# Patient Record
Sex: Female | Born: 1990 | Race: Black or African American | Hispanic: No | Marital: Single | Smoking: Never smoker
Health system: Western US, Academic
[De-identification: ages and names within clinical notes are randomized; demographics above are authoritative.]

## PROBLEM LIST (undated history)

## (undated) DIAGNOSIS — L309 Dermatitis, unspecified: Secondary | ICD-10-CM

## (undated) DIAGNOSIS — F411 Generalized anxiety disorder: Secondary | ICD-10-CM

## (undated) DIAGNOSIS — B009 Herpesviral infection, unspecified: Secondary | ICD-10-CM

## (undated) HISTORY — PX: INDUCED ABORTION: SHX677

## (undated) HISTORY — DX: Dermatitis, unspecified: L30.9

## (undated) HISTORY — DX: Herpesviral infection, unspecified: B00.9

## (undated) HISTORY — DX: Generalized anxiety disorder: F41.1

## (undated) HISTORY — PX: NO SURGICAL HISTORY: 101002

---

## 2012-06-12 ENCOUNTER — Emergency Department (HOSPITAL_COMMUNITY): Payer: BC Managed Care – PPO

## 2012-06-12 ENCOUNTER — Emergency Department (HOSPITAL_COMMUNITY)
Admission: EM | Admit: 2012-06-12 | Discharge: 2012-06-13 | Disposition: A | Discharge status: Home / Self Care | Payer: BC Managed Care – PPO | Attending: Emergency Medicine | Admitting: Emergency Medicine

## 2012-06-12 ENCOUNTER — Encounter (HOSPITAL_COMMUNITY): Payer: Self-pay | Admitting: Emergency Medicine

## 2012-06-12 DIAGNOSIS — Z3202 Encounter for pregnancy test, result negative: Secondary | ICD-10-CM | POA: Insufficient documentation

## 2012-06-12 DIAGNOSIS — R109 Unspecified abdominal pain: Secondary | ICD-10-CM | POA: Insufficient documentation

## 2012-06-12 DIAGNOSIS — Z79899 Other long term (current) drug therapy: Secondary | ICD-10-CM | POA: Insufficient documentation

## 2012-06-12 DIAGNOSIS — R0602 Shortness of breath: Secondary | ICD-10-CM | POA: Insufficient documentation

## 2012-06-12 DIAGNOSIS — R110 Nausea: Secondary | ICD-10-CM | POA: Insufficient documentation

## 2012-06-12 DIAGNOSIS — R509 Fever, unspecified: Secondary | ICD-10-CM | POA: Insufficient documentation

## 2012-06-12 DIAGNOSIS — N12 Tubulo-interstitial nephritis, not specified as acute or chronic: Secondary | ICD-10-CM | POA: Insufficient documentation

## 2012-06-12 LAB — URINALYSIS, ROUTINE W REFLEX MICROSCOPIC
Glucose, UA: NEGATIVE mg/dL
Ketones, ur: 40 mg/dL — AB
Specific Gravity, Urine: 1.025 (ref 1.005–1.030)
pH: 7 (ref 5.0–8.0)

## 2012-06-12 LAB — POCT I-STAT, CHEM 8
Creatinine, Ser: 0.8 mg/dL (ref 0.50–1.10)
Glucose, Bld: 117 mg/dL — ABNORMAL HIGH (ref 70–99)
Hemoglobin: 14.6 g/dL (ref 12.0–15.0)
Potassium: 3.6 mEq/L (ref 3.5–5.1)

## 2012-06-12 LAB — CBC WITH DIFFERENTIAL/PLATELET
Basophils Relative: 0 % (ref 0–1)
Eosinophils Absolute: 0 10*3/uL (ref 0.0–0.7)
HCT: 39.9 % (ref 36.0–46.0)
Hemoglobin: 14.3 g/dL (ref 12.0–15.0)
Lymphocytes Relative: 6 % — ABNORMAL LOW (ref 12–46)
MCH: 31.4 pg (ref 26.0–34.0)
MCHC: 35.8 g/dL (ref 30.0–36.0)
Neutro Abs: 14.2 10*3/uL — ABNORMAL HIGH (ref 1.7–7.7)

## 2012-06-12 LAB — PREGNANCY, URINE: Preg Test, Ur: NEGATIVE

## 2012-06-12 LAB — URINE MICROSCOPIC-ADD ON

## 2012-06-12 MED ORDER — SODIUM CHLORIDE 0.9 % IV SOLN
1000.0000 mL | Freq: Once | INTRAVENOUS | Status: AC
  Administered 2012-06-12: 1000 mL via INTRAVENOUS

## 2012-06-12 MED ORDER — ONDANSETRON HCL 4 MG/2ML IJ SOLN
4.0000 mg | Freq: Once | INTRAMUSCULAR | Status: AC
  Administered 2012-06-12: 4 mg via INTRAVENOUS
  Filled 2012-06-12: qty 2

## 2012-06-12 MED ORDER — SODIUM CHLORIDE 0.9 % IV BOLUS (SEPSIS)
1000.0000 mL | Freq: Once | INTRAVENOUS | Status: AC
  Administered 2012-06-12: 1000 mL via INTRAVENOUS

## 2012-06-12 MED ORDER — MORPHINE SULFATE 4 MG/ML IJ SOLN
4.0000 mg | Freq: Once | INTRAMUSCULAR | Status: AC
  Administered 2012-06-12: 4 mg via INTRAVENOUS
  Filled 2012-06-12: qty 1

## 2012-06-12 MED ORDER — IOHEXOL 300 MG/ML  SOLN
80.0000 mL | Freq: Once | INTRAMUSCULAR | Status: AC | PRN
  Administered 2012-06-12: 80 mL via INTRAVENOUS

## 2012-06-12 MED ORDER — KETOROLAC TROMETHAMINE 30 MG/ML IJ SOLN
30.0000 mg | Freq: Once | INTRAMUSCULAR | Status: AC
  Administered 2012-06-12: 30 mg via INTRAVENOUS
  Filled 2012-06-12: qty 1

## 2012-06-12 MED ORDER — ACETAMINOPHEN 325 MG PO TABS
650.0000 mg | ORAL_TABLET | Freq: Once | ORAL | Status: AC
  Administered 2012-06-12: 650 mg via ORAL
  Filled 2012-06-12: qty 1

## 2012-06-12 MED ORDER — SODIUM CHLORIDE 0.9 % IV SOLN
1000.0000 mL | INTRAVENOUS | Status: DC
  Administered 2012-06-12 (×2): 1000 mL via INTRAVENOUS

## 2012-06-12 MED ORDER — IBUPROFEN 800 MG PO TABS
800.0000 mg | ORAL_TABLET | Freq: Once | ORAL | Status: AC
  Administered 2012-06-12: 800 mg via ORAL
  Filled 2012-06-12: qty 1

## 2012-06-12 MED ORDER — DEXTROSE 5 % IV SOLN
1.0000 g | Freq: Once | INTRAVENOUS | Status: AC
  Administered 2012-06-12: 1 g via INTRAVENOUS
  Filled 2012-06-12: qty 10

## 2012-06-12 MED ORDER — IOHEXOL 300 MG/ML  SOLN
50.0000 mL | Freq: Once | INTRAMUSCULAR | Status: AC | PRN
  Administered 2012-06-12: 50 mL via ORAL

## 2012-06-12 NOTE — ED Notes (Signed)
Pt resting reporting generalized pain "all over".

## 2012-06-12 NOTE — ED Provider Notes (Signed)
History     CSN: [id number]  Arrival date & time 06/12/12  1157   First MD Initiated Contact with Patient 06/12/12 1317      Chief Complaint  Patient presents with  . Chest Pain  . Shortness of Breath    (Consider location/radiation/quality/duration/timing/severity/associated sxs/prior treatment) HPI  Patient relates 2 days ago she started having chest pain in the center of her chest describes a sharp and aching. She states it has been constant. She states she went to sleep for about 2 hours and when she woke up she started having chills. She denies cough, sore throat, rhinorrhea, she states she has mild discomfort of her ears. She has had mild nausea and did have some vomiting last night but not today. She denies diarrhea or constipation. Denies symptoms such as frequency, dysuria, or urgency. She states she has decreased appetite and she also has abdominal pain that she states is around her umbilicus. She states movement and coughing makes the pain worse. She denies discharge.She was seen by her GYN last week and was treated for a yeast infection.  History reviewed. No pertinent past medical history.  Past Surgical History  Procedure Laterality Date  . Induced abortion      No family history on file.  History  Substance Use Topics  . Smoking status: Never Smoker   . Smokeless tobacco: Not on file  . Alcohol Use: Yes  college student  OB History   Grav Para Term Preterm Abortions TAB SAB Ect Mult Living                  Review of Systems  All other systems reviewed and are negative.    Allergies  Review of patient's allergies indicates no known allergies.  Home Medications   Current Outpatient Rx  Name  Route  Sig  Dispense  Refill  . BIOTIN PO   Oral   Take 1 capsule by mouth daily.         Marland Kitchen ibuprofen (ADVIL,MOTRIN) 200 MG tablet   Oral   Take 800 mg by mouth daily as needed for pain. For pain           BP 113/72  Pulse 103  Temp(Src) 102.5 F  (39.2 C) (Oral)  Resp 18  SpO2 100%  LMP 06/02/2012  Vital signs normal except tachycardia and fever   Physical Exam  Nursing note and vitals reviewed. Constitutional: She is oriented to person, place, and time. She appears well-developed and well-nourished.  Non-toxic appearance. She does not appear ill. No distress.  HENT:  Head: Normocephalic and atraumatic.  Right Ear: External ear normal.  Left Ear: External ear normal.  Nose: Nose normal. No mucosal edema or rhinorrhea.  Mouth/Throat: Oropharynx is clear and moist and mucous membranes are normal. No dental abscesses or edematous.  Dry tongue  Eyes: Conjunctivae and EOM are normal. Pupils are equal, round, and reactive to light.  Neck: Normal range of motion and full passive range of motion without pain. Neck supple.  Cardiovascular: Normal rate, regular rhythm and normal heart sounds.  Exam reveals no gallop and no friction rub.   No murmur heard. Pulmonary/Chest: Effort normal and breath sounds normal. No respiratory distress. She has no wheezes. She has no rhonchi. She has no rales. She exhibits no tenderness and no crepitus.  Abdominal: Soft. Normal appearance and bowel sounds are normal. She exhibits no distension. There is tenderness. There is no rebound and no guarding.  Diffuse tenderness, states  most tender around her umbilicus  Musculoskeletal: Normal range of motion. She exhibits no edema and no tenderness.  Moves all extremities well.   Neurological: She is alert and oriented to person, place, and time. She has normal strength. No cranial nerve deficit.  Skin: Skin is warm, dry and intact. No rash noted. No erythema. No pallor.  Feels hot to touch  Psychiatric: Her speech is normal and behavior is normal. Her mood appears not anxious.  Flat affect    ED Course  Procedures (including critical care time)  Medications  0.9 %  sodium chloride infusion (0 mLs Intravenous Stopped 06/12/12 1542)    Followed by  0.9 %   sodium chloride infusion (1,000 mLs Intravenous New Bag/Given 06/12/12 1448)  morphine 4 MG/ML injection 4 mg (4 mg Intravenous Given 06/12/12 1437)  ondansetron (ZOFRAN) injection 4 mg (4 mg Intravenous Given 06/12/12 1435)  iohexol (OMNIPAQUE) 300 MG/ML solution 50 mL (50 mLs Oral Contrast Given 06/12/12 1442)  acetaminophen (TYLENOL) tablet 650 mg (650 mg Oral Given 06/12/12 1447)  ketorolac (TORADOL) 30 MG/ML injection 30 mg (30 mg Intravenous Given 06/12/12 1645)  cefTRIAXone (ROCEPHIN) 1 g in dextrose 5 % 50 mL IVPB (1 g Intravenous New Bag/Given 06/12/12 2127)  iohexol (OMNIPAQUE) 300 MG/ML solution 80 mL (80 mLs Intravenous Contrast Given 06/12/12 2106)  ibuprofen (ADVIL,MOTRIN) tablet 800 mg (800 mg Oral Given 06/12/12 2128)   21:00 Turned over at change of shift waiting to get CT results.  Dr PA Orson Slick will check her CT scan.  Results for orders placed during the hospital encounter of 06/12/12  CBC WITH DIFFERENTIAL      Result Value Range   WBC 16.5 (*) 4.0 - 10.5 K/uL   RBC 4.56  3.87 - 5.11 MIL/uL   Hemoglobin 14.3  12.0 - 15.0 g/dL   HCT 40.9  81.1 - 91.4 %   MCV 87.5  78.0 - 100.0 fL   MCH 31.4  26.0 - 34.0 pg   MCHC 35.8  30.0 - 36.0 g/dL   RDW 78.2  95.6 - 21.3 %   Platelets 200  150 - 400 K/uL   Neutrophils Relative 86 (*) 43 - 77 %   Lymphocytes Relative 6 (*) 12 - 46 %   Monocytes Relative 8  3 - 12 %   Eosinophils Relative 0  0 - 5 %   Basophils Relative 0  0 - 1 %   Neutro Abs 14.2 (*) 1.7 - 7.7 K/uL   Lymphs Abs 1.0  0.7 - 4.0 K/uL   Monocytes Absolute 1.3 (*) 0.1 - 1.0 K/uL   Eosinophils Absolute 0.0  0.0 - 0.7 K/uL   Basophils Absolute 0.0  0.0 - 0.1 K/uL   WBC Morphology INCREASED BANDS (>20% BANDS)    URINALYSIS, ROUTINE W REFLEX MICROSCOPIC      Result Value Range   Color, Urine AMBER (*) YELLOW   APPearance CLOUDY (*) CLEAR   Specific Gravity, Urine 1.025  1.005 - 1.030   pH 7.0  5.0 - 8.0   Glucose, UA NEGATIVE  NEGATIVE mg/dL   Hgb urine dipstick  MODERATE (*) NEGATIVE   Bilirubin Urine NEGATIVE  NEGATIVE   Ketones, ur 40 (*) NEGATIVE mg/dL   Protein, ur 086 (*) NEGATIVE mg/dL   Urobilinogen, UA 1.0  0.0 - 1.0 mg/dL   Nitrite POSITIVE (*) NEGATIVE   Leukocytes, UA MODERATE (*) NEGATIVE  URINE MICROSCOPIC-ADD ON      Result Value Range   Squamous  Epithelial / LPF RARE  RARE   WBC, UA 21-50  <3 WBC/hpf   RBC / HPF 7-10  <3 RBC/hpf   Bacteria, UA MANY (*) RARE  PREGNANCY, URINE      Result Value Range   Preg Test, Ur NEGATIVE  NEGATIVE  POCT I-STAT, CHEM 8      Result Value Range   Sodium 135  135 - 145 mEq/L   Potassium 3.6  3.5 - 5.1 mEq/L   Chloride 101  96 - 112 mEq/L   BUN 8  6 - 23 mg/dL   Creatinine, Ser 7.82  0.50 - 1.10 mg/dL   Glucose, Bld 956 (*) 70 - 99 mg/dL   Calcium, Ion 2.13  0.86 - 1.23 mmol/L   TCO2 26  0 - 100 mmol/L   Hemoglobin 14.6  12.0 - 15.0 g/dL   HCT 57.8  46.9 - 62.9 %  POCT I-STAT TROPONIN I      Result Value Range   Troponin i, poc 0.01  0.00 - 0.08 ng/mL   Comment 3             Laboratory interpretation all normal except poss uti  Dg Chest 2 View  06/12/2012  *RADIOLOGY REPORT*  Clinical Data: Chest pain and shortness of breath.  CHEST - 2 VIEW  Comparison: None  Findings: The cardiomediastinal silhouette is unremarkable. The lungs are clear. There is no evidence of focal airspace disease, pulmonary edema, suspicious pulmonary nodule/mass, pleural effusion, or pneumothorax. No acute bony abnormalities are identified.  IMPRESSION: No evidence of active cardiopulmonary disease.   Original Report Authenticated By: Harmon Pier, M.D.    CT Abdomen/Pelvis pending   1. Fever   2. Abdominal pain   3. Pyelonephritis    Disposition per Dr Orson Slick  Devoria Albe, MD, FACEP    MDM          Ward Givens, MD 06/12/12 2225

## 2012-06-12 NOTE — ED Notes (Signed)
Pt c/o mid chest pain onset Thursday with n/v, dizziness and shortness of breath. Pt reports pain radiates to back and also c/o generalized pain to arms and legs. Pt with labored breathing in triage.

## 2012-06-12 NOTE — ED Notes (Signed)
EKG done given to Dr/ Lynelle Doctor by EMT Darrell.

## 2012-06-12 NOTE — ED Notes (Signed)
Pt drinking contrast. 

## 2012-06-12 NOTE — ED Provider Notes (Signed)
Wanda Morales S 8:30 PM patient discussed in sign out with Dr. Devoria Albe. Patient with complaints of fever, nausea vomiting and some abdominal and back discomforts. CT scan pending. There are signs of UTI.  Patient continues to be mildly tachycardic IV boluses provided. Patient also febrile on last temperature reading. Ibuprofen ordered.   CT scan shows signs consistent with left-sided pyelonephritis. This is consistent with fever and exam findings. Ceftriaxone ordered and continued IV fluids provided. Patient is tolerating by mouth fluids at this time after antibiotics. If heart rate improves patient felt stable for initial outpatient treatment.  Pt continues to do much better.  She no feels ready to return home. Her HR has improved.  Fever also improved.  At this time will tx outpt.  Angus Seller, PA-C 06/13/12 [phone]

## 2012-06-12 NOTE — ED Notes (Signed)
Patient transported to X-ray 

## 2012-06-12 NOTE — ED Notes (Signed)
Pt c/o chills and fever, pt's oral temp 103.1, Dr. Lynelle Doctor informed, she informed me to speak with EDPA Theron Arista about this.

## 2012-06-13 ENCOUNTER — Encounter (HOSPITAL_COMMUNITY): Payer: Self-pay | Admitting: Emergency Medicine

## 2012-06-13 ENCOUNTER — Inpatient Hospital Stay (HOSPITAL_COMMUNITY)
Admission: EM | Admit: 2012-06-13 | Discharge: 2012-06-15 | DRG: 321 | Disposition: A | Discharge status: Home / Self Care | Payer: BC Managed Care – PPO | Attending: Internal Medicine | Admitting: Internal Medicine

## 2012-06-13 DIAGNOSIS — A498 Other bacterial infections of unspecified site: Secondary | ICD-10-CM | POA: Diagnosis present

## 2012-06-13 DIAGNOSIS — R000 Tachycardia, unspecified: Secondary | ICD-10-CM

## 2012-06-13 DIAGNOSIS — D649 Anemia, unspecified: Secondary | ICD-10-CM | POA: Diagnosis present

## 2012-06-13 DIAGNOSIS — N10 Acute tubulo-interstitial nephritis: Principal | ICD-10-CM | POA: Diagnosis present

## 2012-06-13 MED ORDER — DEXTROSE 5 % IV SOLN
1.0000 g | INTRAVENOUS | Status: DC
  Administered 2012-06-13 – 2012-06-15 (×3): 1 g via INTRAVENOUS
  Filled 2012-06-13 (×3): qty 10

## 2012-06-13 MED ORDER — HYDROMORPHONE HCL PF 1 MG/ML IJ SOLN
0.5000 mg | Freq: Once | INTRAMUSCULAR | Status: AC
  Administered 2012-06-13: 0.5 mg via INTRAVENOUS
  Filled 2012-06-13: qty 1

## 2012-06-13 MED ORDER — ONDANSETRON 8 MG PO TBDP: ORAL_TABLET | ORAL | Status: DC

## 2012-06-13 MED ORDER — ONDANSETRON HCL 4 MG/2ML IJ SOLN
4.0000 mg | Freq: Once | INTRAMUSCULAR | Status: AC
  Administered 2012-06-13: 4 mg via INTRAVENOUS
  Filled 2012-06-13: qty 2

## 2012-06-13 MED ORDER — ACETAMINOPHEN 325 MG PO TABS
650.0000 mg | ORAL_TABLET | Freq: Four times a day (QID) | ORAL | Status: DC | PRN
  Administered 2012-06-13 – 2012-06-15 (×4): 650 mg via ORAL
  Filled 2012-06-13 (×4): qty 2

## 2012-06-13 MED ORDER — SODIUM CHLORIDE 0.9 % IV BOLUS (SEPSIS)
500.0000 mL | Freq: Once | INTRAVENOUS | Status: AC
  Administered 2012-06-13: 08:00:00 via INTRAVENOUS

## 2012-06-13 MED ORDER — ACETAMINOPHEN 650 MG RE SUPP: 650.0000 mg | Freq: Four times a day (QID) | RECTAL | Status: DC | PRN

## 2012-06-13 MED ORDER — WHITE PETROLATUM GEL
Status: AC
  Administered 2012-06-13: 1
  Filled 2012-06-13: qty 5

## 2012-06-13 MED ORDER — INFLUENZA VIRUS VACC SPLIT PF IM SUSP
0.5000 mL | INTRAMUSCULAR | Status: AC
  Administered 2012-06-15: 0.5 mL via INTRAMUSCULAR
  Filled 2012-06-13: qty 0.5

## 2012-06-13 MED ORDER — ONDANSETRON HCL 4 MG/2ML IJ SOLN
4.0000 mg | Freq: Four times a day (QID) | INTRAMUSCULAR | Status: DC | PRN
  Administered 2012-06-14: 4 mg via INTRAVENOUS
  Filled 2012-06-13: qty 2

## 2012-06-13 MED ORDER — ACETAMINOPHEN 500 MG PO TABS: 1000.0000 mg | ORAL_TABLET | Freq: Once | ORAL | Status: DC

## 2012-06-13 MED ORDER — SULFAMETHOXAZOLE-TRIMETHOPRIM 800-160 MG PO TABS: 1.0000 | ORAL_TABLET | Freq: Two times a day (BID) | ORAL | Status: DC

## 2012-06-13 MED ORDER — ENOXAPARIN SODIUM 40 MG/0.4ML ~~LOC~~ SOLN
40.0000 mg | SUBCUTANEOUS | Status: DC
  Administered 2012-06-13 – 2012-06-14 (×2): 40 mg via SUBCUTANEOUS
  Filled 2012-06-13 (×3): qty 0.4

## 2012-06-13 MED ORDER — SODIUM CHLORIDE 0.9 % IV SOLN
INTRAVENOUS | Status: DC
  Administered 2012-06-13 – 2012-06-15 (×5): via INTRAVENOUS

## 2012-06-13 MED ORDER — ONDANSETRON HCL 4 MG PO TABS: 4.0000 mg | ORAL_TABLET | Freq: Four times a day (QID) | ORAL | Status: DC | PRN

## 2012-06-13 MED ORDER — MORPHINE SULFATE 2 MG/ML IJ SOLN
2.0000 mg | INTRAMUSCULAR | Status: DC | PRN
  Administered 2012-06-13: 4 mg via INTRAVENOUS
  Administered 2012-06-13 (×2): 2 mg via INTRAVENOUS
  Filled 2012-06-13 (×2): qty 1
  Filled 2012-06-13: qty 2

## 2012-06-13 NOTE — Progress Notes (Signed)
Pt arrived to unit from ED. Alert and oriented. No complaints of pain or nausea. Skin intact. Oriented pt to unit. Bed in low position. Call bell within reach. Will continue to monitor.

## 2012-06-13 NOTE — ED Provider Notes (Signed)
Medical screening examination/treatment/procedure(s) were performed by non-physician practitioner and as supervising physician I was immediately available for consultation/collaboration.  Hurman Horn, MD 06/13/12 [phone]

## 2012-06-13 NOTE — ED Notes (Signed)
Patient transferred to [id number] via stretcher, report call to [facility].

## 2012-06-13 NOTE — Progress Notes (Signed)
Utilization review completed.  

## 2012-06-13 NOTE — H&P (Signed)
Date: 06/13/2012               Patient Name:  Wanda Morales MRN: [id number]  DOB: 10-07-1990 Age / Sex: 22 y.o., female   PCP: Provider Default, MD              Medical Service: Internal Medicine Teaching Service              Attending Physician: Dr. Dalphine Handing    First Contact: Dr. Elenor Legato Pager: [phone]  Second Contact: Dr. Stacy Gardner Pager: [phone]            After Hours (After 5p/  First Contact Pager: [phone]  weekends / holidays): Second Contact Pager: [phone]     Chief Complaint: Fever, L flank pain  History of Present Illness: Patient is a 22 y.o. female with PMH of previous UTIs, who presents to [facility] for evaluation of fever and L flank pain. The patient states the symptoms started approximately 3 days prior to admission. She has been nauseated and has had one episode of emesis since symptoms began. She also has noted some foul-smelling urine during that same period of time, but denies dysuria or hematuria. She presented to the ED yesterday with these complaints, and was discharged home with a prescription for Bactrim after receiving one dose of IV ceftriaxone in the ED. The patient states that she returned to the ED today as her symptoms were worsening. She feels that her fever is now worse than it was previously, and her left flank pain persists.   The patient has a history of previous UTIs, with her most recent being in 10/2011, at which time she states that she had a fever but denied having any flank pain with his previous episode. She has no history of pyelonephritis to her knowledge. Risk factors for development of urinary tract infections in this patient include recent sexual activity.  Review of Systems: Review of Systems  Constitutional: Positive for fever, chills and diaphoresis.  HENT: Negative.   Eyes: Negative.   Respiratory: Negative.   Cardiovascular: Positive for chest pain.  Gastrointestinal: Positive for nausea and vomiting.  Genitourinary:  Positive for flank pain (L). Negative for dysuria and hematuria.  Skin: Negative.   Neurological: Negative.   Endo/Heme/Allergies: Negative.   Psychiatric/Behavioral: Negative.      Current Outpatient Medications: No current facility-administered medications on file prior to encounter.   Current Outpatient Prescriptions on File Prior to Encounter  Medication Sig Dispense Refill  . BIOTIN PO Take 1 capsule by mouth daily.      Marland Kitchen ibuprofen (ADVIL,MOTRIN) 200 MG tablet Take 800 mg by mouth daily as needed for pain. For pain      . sulfamethoxazole-trimethoprim (SEPTRA DS) 800-160 MG per tablet Take 1 tablet by mouth every 12 (twelve) hours.  20 tablet  0     Allergies: No Known Allergies   Past Medical History: History reviewed. No pertinent past medical history.  Past Surgical History: Past Surgical History  Procedure Laterality Date  . Induced abortion      Family History: History reviewed. No pertinent family history.  Social History: History   Social History  . Marital Status: Single    Spouse Name: N/A    Number of Children: N/A  . Years of Education: N/A   Occupational History  . Not on file.   Social History Main Topics  . Smoking status: Never Smoker   . Smokeless tobacco: Not on file  . Alcohol Use: Yes  .  Drug Use: No  . Sexually Active: Not on file   Other Topics Concern  . Not on file   Social History Narrative  . No narrative on file     Vital Signs: Blood pressure 107/63, pulse 101, temperature 101.8 F (38.8 C), temperature source Oral, resp. rate 18, last menstrual period 06/02/2012, SpO2 97.00%.  Physical Exam: General: Vital signs reviewed and noted. In mild distress 2/2 pain.   Head: Normocephalic, atraumatic.  Eyes: PERRL, EOMI, No signs of anemia or jaundice.  Neck: No deformities, masses, or tenderness noted..  Lungs:  Normal respiratory effort. Clear to auscultation BL without crackles or wheezes.  Heart: RRR. S1 and S2 normal  without gallop, murmur, or rubs.  Abdomen:  BS normoactive. Soft, Nondistended, non-tender.  L CVA tenderness.  Extremities: No pretibial edema.  Neurologic: A&O X3, CN II - XII are grossly intact. Motor strength is 5/5 in the all 4 extremities, Sensations intact to light touch, Cerebellar signs negative.  Skin: No visible rashes, scars.   Lab results: Comprehensive Metabolic Panel:    Component Value Date/Time   NA 135 06/12/2012 1236   K 3.6 06/12/2012 1236   CL 101 06/12/2012 1236   BUN 8 06/12/2012 1236   CREATININE 0.80 06/12/2012 1236   GLUCOSE 117* 06/12/2012 1236    CBC:    Component Value Date/Time   WBC 16.5* 06/12/2012 1208   HGB 14.6 06/12/2012 1236   HCT 43.0 06/12/2012 1236   PLT 200 06/12/2012 1208   MCV 87.5 06/12/2012 1208   NEUTROABS 14.2* 06/12/2012 1208   LYMPHSABS 1.0 06/12/2012 1208   MONOABS 1.3* 06/12/2012 1208   EOSABS 0.0 06/12/2012 1208   BASOSABS 0.0 06/12/2012 1208    Urinalysis:   Recent Labs  06/12/12 1325  COLORURINE AMBER*  LABSPEC 1.025  PHURINE 7.0  GLUCOSEU NEGATIVE  HGBUR MODERATE*  BILIRUBINUR NEGATIVE  KETONESUR 40*  PROTEINUR 100*  UROBILINOGEN 1.0  NITRITE POSITIVE*  LEUKOCYTESUR MODERATE*    Troponin (Point of Care Test)  Recent Labs  06/12/12 1234  TROPIPOC 0.01     Imaging results:  Dg Chest 2 View  06/12/2012  *RADIOLOGY REPORT*  Clinical Data: Chest pain and shortness of breath.  CHEST - 2 VIEW  Comparison: None  Findings: The cardiomediastinal silhouette is unremarkable. The lungs are clear. There is no evidence of focal airspace disease, pulmonary edema, suspicious pulmonary nodule/mass, pleural effusion, or pneumothorax. No acute bony abnormalities are identified.  IMPRESSION: No evidence of active cardiopulmonary disease.   Original Report Authenticated By: Harmon Pier, M.D.    Ct Abdomen Pelvis W Contrast  06/12/2012  *RADIOLOGY REPORT*  Clinical Data: Lower abdominal pain.  Periumbilical pain.  Nausea and vomiting.   Fever.  CT ABDOMEN AND PELVIS WITH CONTRAST  Technique:  Multidetector CT imaging of the abdomen and pelvis was performed following the standard protocol during bolus administration of intravenous contrast.  Contrast: 80mL OMNIPAQUE IOHEXOL 300 MG/ML  SOLN  Comparison: None.  Findings: The lung bases are clear.  The liver, spleen, gallbladder, pancreas, adrenal glands, abdominal aorta, and retroperitoneal lymph nodes are unremarkable.  There is enlargement of the left kidney with respect to the right and the left renal nephrogram is heterogeneous. The left nephrogram demonstrates focal areas of hypo enhancement and cystic change. Changes  suggest pyelonephritis.  There is no pyelocaliectasis or ureterectasis.  No stones are identified.  The stomach, small bowel, and colon are not abnormally distended.  Scattered stool in the colon.  No free  air or free fluid in the abdomen.  Pelvis:  Bladder wall is not thickened.  Uterus and ovaries are not enlarged.  Small amount of free fluid in the pelvis likely representing physiologic fluid.  No evidence of diverticulitis. The appendix is normal.  Normal alignment of the lumbar vertebrae.  IMPRESSION: Enlarged left kidney with abnormal nephrogram suggesting pyelonephritis.   Original Report Authenticated By: Burman Nieves, M.D.    Assessment & Plan: Pt is a 22 y.o. yo female with a PMHx of previous UTIs who was admitted on 06/13/2012 with symptoms of fever/L flank pain, which was determined to be secondary to pyelonephritis.  Pyelonephritis - UA revealed mod leukocytes and Hgb, with many bacteria. Nitrite positive. Urine culture drawn yesterday, with results pending. CT abdomen revealed findings suggesting L pyelonephritis. Due to the patient's significant signs/symptoms associated with her pyelonephritis, including tachycardia to ~110bpm, fever with 24 hour max temp = 103.1,leukocytosis with WBC = 16.5, and severe pain, she will be admitted for inpatient treatment at  this time. IV ceftriaxone will be continued. Management of pain and nausea per below. The patient does not appear to have any risk factors for pyelonephritis other than her history of urinary tract infections. - admit to floor - monitor vitals - 1g IV ceftriaxone q24h - morphine 2-4mg  q4h PRN - zofran PRN    DVT PPX - enoxaparin  CODE STATUS - full  CONSULTS PLACED - N/A  DISPO - Disposition is deferred at this time, awaiting improvement of current medical problems.   Anticipated discharge in approximately 1-2 day(s).   The patient does not have a current PCP (Provider Default, MD) and may need an [facility] hospital follow-up appointment after discharge.    Is the [facility] hospital follow-up appointment a one-time only appointment? not applicable.  Does the patient have transportation limitations that hinder transportation to clinic appointments? no   SERVICE NEEDED AT DISCHARGE - TO BE DETERMINED DURING HOSPITAL COURSE         Y = Yes, Blank = No PT:   OT:   RN:   Equipment:   Other:     Signed: Elfredia Nevins, MD  PGY-1, Internal Medicine Resident Pager: [phone]) 06/13/2012, 8:59 AM

## 2012-06-13 NOTE — H&P (Signed)
Agree with note by Laura Malone, MS-III. Please see my separate H&P for further details and clarifications.  

## 2012-06-13 NOTE — ED Notes (Signed)
Patient brought in via [facility] chief complaint left flank pain, and fever. Patient was at [facility] ED until midnight and was discharged with a UTI. She returns this am without getting prescriptions. EMS gave 1000 mgs of Tylenol PO at 7 am.

## 2012-06-13 NOTE — H&P (Signed)
Medical Student Hospital Admission Note Date: 06/13/2012  Patient name: Wanda Morales Medical record number: [id number] Date of birth: [birth year] Age: 22 y.o. Gender: female PCP: Default, Provider, MD  Medical Service: Internal Medicine Teaching Service  Attending physician: Aletta Edouard, MD    Chief Complaint: pyelonephritis  History of Present Illness: Wanda Morales is a 22yo female with no pertinent PMHx who presents to the ED complaining of left sided lower back pain, fever up to 39.5 degrees celsius, chills and mild dysuria.  Symptoms started 3 days ago with some chest discomfort and bilateral back pain.  The next day, she developed fever, chills and increased urinary frequency.  She presented to the ED yesterday when fevers worsened and she developed n/v.  CT scan showed left-sided pyelonephritis, and pt was given IV boluses, ibuprofen and ceftriaxone, felt much better, and went home with oral bactrim.  She never filled her bactrim prescription but instead symptoms worsened and she returned to the ED this morning.  She has a history of recurrent UTIs but never pyelonephritis per patient report.  She is sexually active and recently had intercourse 1 week ago.  She went to her PCP earlier this week for suspected UTI but U/A returned normal.  Her history of induced abortion was a couple years ago.  Denies hematuria, foul smelling urine.    Meds: Current Outpatient Rx  Name  Route  Sig  Dispense  Refill  . BIOTIN PO   Oral   Take 1 capsule by mouth daily.         Marland Kitchen ibuprofen (ADVIL,MOTRIN) 200 MG tablet   Oral   Take 800 mg by mouth daily as needed for pain. For pain         . ondansetron (ZOFRAN-ODT) 8 MG disintegrating tablet   Oral   Take 8 mg by mouth every 4 (four) hours as needed. 8mg  ODT q4 hours prn nausea         . sulfamethoxazole-trimethoprim (SEPTRA DS) 800-160 MG per tablet   Oral   Take 1 tablet by mouth every 12 (twelve) hours.   20 tablet   0      Allergies: Allergies as of 06/13/2012  . (No Known Allergies)   History reviewed. No pertinent past medical history. Past Surgical History  Procedure Laterality Date  . Induced abortion     History reviewed. No pertinent family history. History   Social History  . Marital Status: Single    Spouse Name: N/A    Number of Children: N/A  . Years of Education: N/A   Occupational History  . Not on file.   Social History Main Topics  . Smoking status: Never Smoker   . Smokeless tobacco: Not on file  . Alcohol Use: Yes  . Drug Use: No  . Sexually Active: Yes   Other Topics Concern  . Not on file   Social History Narrative  . No narrative on file    Review of Systems: A comprehensive review of systems was negative except for: Constitutional: positive for chills and fevers Gastrointestinal: positive for nausea and vomiting Genitourinary: positive for dysuria and frequency Musculoskeletal: positive for back pain  Physical Exam: Blood pressure 107/63, pulse 101, temperature 101.8 F (38.8 C), temperature source Oral, resp. rate 18, last menstrual period 06/02/2012, SpO2 97.00%. General: resting in bed in mild distress HEENT: PERRL, EOMI, no scleral icterus Cardiac: RRR, no rubs, murmurs or gallops Pulm: clear to auscultation bilaterally, moving normal volumes of air Abd: soft, nontender, nondistended,  BS present, + left flank tenderness  Ext: warm and well perfused, no pedal edema Neuro: alert and oriented X3, cranial nerves II-XII grossly intact   Lab results: CBC    Component Value Date/Time   WBC 16.5* 06/12/2012 1208   RBC 4.56 06/12/2012 1208   HGB 14.6 06/12/2012 1236   HCT 43.0 06/12/2012 1236   PLT 200 06/12/2012 1208   MCV 87.5 06/12/2012 1208   MCH 31.4 06/12/2012 1208   MCHC 35.8 06/12/2012 1208   RDW 12.6 06/12/2012 1208   LYMPHSABS 1.0 06/12/2012 1208   MONOABS 1.3* 06/12/2012 1208   EOSABS 0.0 06/12/2012 1208   BASOSABS 0.0 06/12/2012 1208    CMP      Component Value Date/Time   NA 135 06/12/2012 1236   K 3.6 06/12/2012 1236   CL 101 06/12/2012 1236   GLUCOSE 117* 06/12/2012 1236   BUN 8 06/12/2012 1236   CREATININE 0.80 06/12/2012 1236   Urinalysis    Component Value Date/Time   COLORURINE AMBER* 06/12/2012 1325   APPEARANCEUR CLOUDY* 06/12/2012 1325   LABSPEC 1.025 06/12/2012 1325   PHURINE 7.0 06/12/2012 1325   GLUCOSEU NEGATIVE 06/12/2012 1325   HGBUR MODERATE* 06/12/2012 1325   BILIRUBINUR NEGATIVE 06/12/2012 1325   KETONESUR 40* 06/12/2012 1325   PROTEINUR 100* 06/12/2012 1325   UROBILINOGEN 1.0 06/12/2012 1325   NITRITE POSITIVE* 06/12/2012 1325   LEUKOCYTESUR MODERATE* 06/12/2012 1325   UPT: negative    Imaging results:  Dg Chest 2 View  06/12/2012  *RADIOLOGY REPORT*  Clinical Data: Chest pain and shortness of breath.  CHEST - 2 VIEW  Comparison: None  Findings: The cardiomediastinal silhouette is unremarkable. The lungs are clear. There is no evidence of focal airspace disease, pulmonary edema, suspicious pulmonary nodule/mass, pleural effusion, or pneumothorax. No acute bony abnormalities are identified.  IMPRESSION: No evidence of active cardiopulmonary disease.   Original Report Authenticated By: Harmon Pier, M.D.    Ct Abdomen Pelvis W Contrast  06/12/2012  *RADIOLOGY REPORT*  Clinical Data: Lower abdominal pain.  Periumbilical pain.  Nausea and vomiting.  Fever.  CT ABDOMEN AND PELVIS WITH CONTRAST  Technique:  Multidetector CT imaging of the abdomen and pelvis was performed following the standard protocol during bolus administration of intravenous contrast.  Contrast: 80mL OMNIPAQUE IOHEXOL 300 MG/ML  SOLN  Comparison: None.  Findings: The lung bases are clear.  The liver, spleen, gallbladder, pancreas, adrenal glands, abdominal aorta, and retroperitoneal lymph nodes are unremarkable.  There is enlargement of the left kidney with respect to the right and the left renal nephrogram is heterogeneous. The left nephrogram demonstrates  focal areas of hypo enhancement and cystic change. Changes  suggest pyelonephritis.  There is no pyelocaliectasis or ureterectasis.  No stones are identified.  The stomach, small bowel, and colon are not abnormally distended.  Scattered stool in the colon.  No free air or free fluid in the abdomen.  Pelvis:  Bladder wall is not thickened.  Uterus and ovaries are not enlarged.  Small amount of free fluid in the pelvis likely representing physiologic fluid.  No evidence of diverticulitis. The appendix is normal.  Normal alignment of the lumbar vertebrae.  IMPRESSION: Enlarged left kidney with abnormal nephrogram suggesting pyelonephritis.   Original Report Authenticated By: Burman Nieves, M.D.     Other results: EKG: sinus tachycardia.  Assessment & Plan by Problem: Ms. Shipp is a previously healthy 22yo female with no pertinent PMHx who complains of left sided flank pain, mild dysuria, fever and  chills for the past 3 days.  She is currently stable but in moderate discomfort.   Active Problems:   Pyelonephritis, acute: Pt will be admitted for pain management and IV antibiotics for pyelonephritis.  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  She never filled her bactrim prescription but received a dose of ceftriaxone in the hospital yesterday, so we will continue this medication until sensitivities return. -IV NS fluids 29mL/hr -IV ceftriaxone -IV morphine PRN for pain -zofran PRN for nausea   This is a Psychologist, occupational Note.  The care of the patient was discussed with Dr. Lavena Bullion and the assessment and plan was formulated with their assistance.  Please see their note for official documentation of the patient encounter.   Signed: Esau Grew 06/13/2012, 9:00 AM

## 2012-06-13 NOTE — ED Notes (Signed)
The patient is AOx4 and comfortable with her discharge instructions. 

## 2012-06-13 NOTE — ED Provider Notes (Signed)
History     CSN: [id number]  Arrival date & time 06/13/12  0716   First MD Initiated Contact with Patient 06/13/12 [id number]      Chief Complaint  Patient presents with  . Urinary Tract Infection    (Consider location/radiation/quality/duration/timing/severity/associated sxs/prior treatment) HPI Comments: Wanda Morales is a 22 y.o. female with no significant past medical history presents emergency department after being discharged from the hospital with pyelonephritis.  Patient was diagnosed last evening and treated in the emergency department with IV fluids, Rocephin, morphine, and Toradol.  CT imaging confirmed diagnosis of pyelonephritis.  Patient reports that she woke up at 3 in the morning diaphoretic with worsened abdominal pain and a fever of 104.  EMS was called who was treated patient with 1000 Tylenol by mouth.  Patient denies any new symptoms.  The history is provided by the patient.    History reviewed. No pertinent past medical history.  Past Surgical History  Procedure Laterality Date  . Induced abortion      History reviewed. No pertinent family history.  History  Substance Use Topics  . Smoking status: Never Smoker   . Smokeless tobacco: Not on file  . Alcohol Use: Yes    OB History   Grav Para Term Preterm Abortions TAB SAB Ect Mult Living                  Review of Systems  Constitutional: Negative for fever, diaphoresis and activity change.  HENT: Negative for congestion and neck pain.   Respiratory: Negative for cough.   Gastrointestinal: Positive for nausea, vomiting and abdominal pain.  Genitourinary: Positive for dysuria, frequency, hematuria, flank pain and difficulty urinating.  Musculoskeletal: Negative for myalgias.  Skin: Negative for color change and wound.  Neurological: Negative for headaches.  All other systems reviewed and are negative.    Allergies  Review of patient's allergies indicates no known allergies.  Home Medications    Current Outpatient Rx  Name  Route  Sig  Dispense  Refill  . BIOTIN PO   Oral   Take 1 capsule by mouth daily.         Marland Kitchen ibuprofen (ADVIL,MOTRIN) 200 MG tablet   Oral   Take 800 mg by mouth daily as needed for pain. For pain         . ondansetron (ZOFRAN ODT) 8 MG disintegrating tablet      8mg  ODT q4 hours prn nausea   20 tablet   0   . sulfamethoxazole-trimethoprim (SEPTRA DS) 800-160 MG per tablet   Oral   Take 1 tablet by mouth every 12 (twelve) hours.   20 tablet   0     BP 110/68  Pulse 128  Temp(Src) 101.8 F (38.8 C) (Oral)  Resp 18  SpO2 96%  LMP 06/02/2012  Physical Exam  Nursing note and vitals reviewed. Constitutional: She is oriented to person, place, and time. She appears well-developed and well-nourished. She appears distressed.  HENT:  Head: Normocephalic and atraumatic.  Eyes: Conjunctivae and EOM are normal.  Neck: Normal range of motion.  Pulmonary/Chest: Effort normal.  Abdominal:  Suprapubic abdominal tenderness, no peritoneal signs.  Musculoskeletal: Normal range of motion.  Neurological: She is alert and oriented to person, place, and time.  Skin: Skin is warm and dry. No rash noted. She is not diaphoretic.  Psychiatric: She has a normal mood and affect. Her behavior is normal.    ED Course  Procedures (including critical care time)  Results  for orders placed during the hospital encounter of 06/12/12 (from the past 24 hour(s))  CBC WITH DIFFERENTIAL     Status: Abnormal   Collection Time    06/12/12 12:08 PM      Result Value Range   WBC 16.5 (*) 4.0 - 10.5 K/uL   RBC 4.56  3.87 - 5.11 MIL/uL   Hemoglobin 14.3  12.0 - 15.0 g/dL   HCT 16.1  09.6 - 04.5 %   MCV 87.5  78.0 - 100.0 fL   MCH 31.4  26.0 - 34.0 pg   MCHC 35.8  30.0 - 36.0 g/dL   RDW 40.9  81.1 - 91.4 %   Platelets 200  150 - 400 K/uL   Neutrophils Relative 86 (*) 43 - 77 %   Lymphocytes Relative 6 (*) 12 - 46 %   Monocytes Relative 8  3 - 12 %   Eosinophils  Relative 0  0 - 5 %   Basophils Relative 0  0 - 1 %   Neutro Abs 14.2 (*) 1.7 - 7.7 K/uL   Lymphs Abs 1.0  0.7 - 4.0 K/uL   Monocytes Absolute 1.3 (*) 0.1 - 1.0 K/uL   Eosinophils Absolute 0.0  0.0 - 0.7 K/uL   Basophils Absolute 0.0  0.0 - 0.1 K/uL   WBC Morphology INCREASED BANDS (>20% BANDS)    POCT I-STAT TROPONIN I     Status: None   Collection Time    06/12/12 12:34 PM      Result Value Range   Troponin i, poc 0.01  0.00 - 0.08 ng/mL   Comment 3           POCT I-STAT, CHEM 8     Status: Abnormal   Collection Time    06/12/12 12:36 PM      Result Value Range   Sodium 135  135 - 145 mEq/L   Potassium 3.6  3.5 - 5.1 mEq/L   Chloride 101  96 - 112 mEq/L   BUN 8  6 - 23 mg/dL   Creatinine, Ser 7.82  0.50 - 1.10 mg/dL   Glucose, Bld 956 (*) 70 - 99 mg/dL   Calcium, Ion 2.13  0.86 - 1.23 mmol/L   TCO2 26  0 - 100 mmol/L   Hemoglobin 14.6  12.0 - 15.0 g/dL   HCT 57.8  46.9 - 62.9 %  URINALYSIS, ROUTINE W REFLEX MICROSCOPIC     Status: Abnormal   Collection Time    06/12/12  1:25 PM      Result Value Range   Color, Urine AMBER (*) YELLOW   APPearance CLOUDY (*) CLEAR   Specific Gravity, Urine 1.025  1.005 - 1.030   pH 7.0  5.0 - 8.0   Glucose, UA NEGATIVE  NEGATIVE mg/dL   Hgb urine dipstick MODERATE (*) NEGATIVE   Bilirubin Urine NEGATIVE  NEGATIVE   Ketones, ur 40 (*) NEGATIVE mg/dL   Protein, ur 528 (*) NEGATIVE mg/dL   Urobilinogen, UA 1.0  0.0 - 1.0 mg/dL   Nitrite POSITIVE (*) NEGATIVE   Leukocytes, UA MODERATE (*) NEGATIVE  URINE MICROSCOPIC-ADD ON     Status: Abnormal   Collection Time    06/12/12  1:25 PM      Result Value Range   Squamous Epithelial / LPF RARE  RARE   WBC, UA 21-50  <3 WBC/hpf   RBC / HPF 7-10  <3 RBC/hpf   Bacteria, UA MANY (*) RARE  PREGNANCY, URINE  Status: None   Collection Time    06/12/12  1:25 PM      Result Value Range   Preg Test, Ur NEGATIVE  NEGATIVE   Above labs were preformed by previous provider at initial evaluation.    Labs Reviewed - No data to display Dg Chest 2 View  06/12/2012  *RADIOLOGY REPORT*  Clinical Data: Chest pain and shortness of breath.  CHEST - 2 VIEW  Comparison: None  Findings: The cardiomediastinal silhouette is unremarkable. The lungs are clear. There is no evidence of focal airspace disease, pulmonary edema, suspicious pulmonary nodule/mass, pleural effusion, or pneumothorax. No acute bony abnormalities are identified.  IMPRESSION: No evidence of active cardiopulmonary disease.   Original Report Authenticated By: Harmon Pier, M.D.    Ct Abdomen Pelvis W Contrast  06/12/2012  *RADIOLOGY REPORT*  Clinical Data: Lower abdominal pain.  Periumbilical pain.  Nausea and vomiting.  Fever.  CT ABDOMEN AND PELVIS WITH CONTRAST  Technique:  Multidetector CT imaging of the abdomen and pelvis was performed following the standard protocol during bolus administration of intravenous contrast.  Contrast: 80mL OMNIPAQUE IOHEXOL 300 MG/ML  SOLN  Comparison: None.  Findings: The lung bases are clear.  The liver, spleen, gallbladder, pancreas, adrenal glands, abdominal aorta, and retroperitoneal lymph nodes are unremarkable.  There is enlargement of the left kidney with respect to the right and the left renal nephrogram is heterogeneous. The left nephrogram demonstrates focal areas of hypo enhancement and cystic change. Changes  suggest pyelonephritis.  There is no pyelocaliectasis or ureterectasis.  No stones are identified.  The stomach, small bowel, and colon are not abnormally distended.  Scattered stool in the colon.  No free air or free fluid in the abdomen.  Pelvis:  Bladder wall is not thickened.  Uterus and ovaries are not enlarged.  Small amount of free fluid in the pelvis likely representing physiologic fluid.  No evidence of diverticulitis. The appendix is normal.  Normal alignment of the lumbar vertebrae.  IMPRESSION: Enlarged left kidney with abnormal nephrogram suggesting pyelonephritis.   Original Report  Authenticated By: Burman Nieves, M.D.      No diagnosis found.    MDM  Pyelonephritis  22 year old female presents emergency department tachycardic and febrile status post discharge from the emergency department with diagnosis of pyonephritis.  In reviewing chart patient's vital signs did improve prior to discharge after her hospital treatment, however since patient is having worsening abdominal pain, nausea, vomiting and unstable vital signs she will be admitted for observation and continued IV antibiotics.  The patient appears reasonably stabilized for admission considering the current resources, flow, and capabilities available in the ED at this time, and I doubt any other [facility] requiring further screening and/or treatment in the ED prior to admission.         Jaci Carrel, PA-C 06/13/12 0900

## 2012-06-13 NOTE — ED Provider Notes (Signed)
Medical screening examination/treatment/procedure(s) were performed by non-physician practitioner and as supervising physician I was immediately available for consultation/collaboration.  Flint Melter, MD 06/13/12 [phone]

## 2012-06-14 LAB — BASIC METABOLIC PANEL
Calcium: 8.6 mg/dL (ref 8.4–10.5)
GFR calc non Af Amer: >90 mL/min (ref >90)
Sodium: 138 mEq/L (ref 135–145)

## 2012-06-14 LAB — GC/CHLAMYDIA PROBE AMP
CT Probe RNA: NEGATIVE
GC Probe RNA: NEGATIVE

## 2012-06-14 MED ORDER — POTASSIUM CHLORIDE CRYS ER 20 MEQ PO TBCR
40.0000 meq | EXTENDED_RELEASE_TABLET | Freq: Once | ORAL | Status: AC
  Administered 2012-06-14: 40 meq via ORAL
  Filled 2012-06-14: qty 2

## 2012-06-14 MED ORDER — HYDROMORPHONE HCL PF 1 MG/ML IJ SOLN
1.0000 mg | INTRAMUSCULAR | Status: DC | PRN
  Administered 2012-06-14 (×3): 1 mg via INTRAVENOUS
  Filled 2012-06-14 (×3): qty 1

## 2012-06-14 NOTE — Progress Notes (Signed)
Resident Addendum to Medical Student Note   I have seen and examined the patient, and agree with the the medical student assessment and plan outlined above. Please see my brief note below for additional details.  S: Patient feeling only marginally better this AM. Still having fevers regularly, and her L flank pain persists (although moderately improved with opioids).    OBJECTIVE: VS: Reviewed  Meds: Reviewed  Labs: Reviewed  Imaging: Reviewed   Physical Exam: General: Vital signs reviewed and noted. Well-developed, well-nourished, in no acute distress; alert, appropriate and cooperative throughout examination.  HEENT: Normocephalic, atraumatic  Lungs:  Normal respiratory effort. Clear to auscultation BL without crackles or wheezes.  Heart: RRR. S1 and S2 normal without gallop, murmur, or rubs.  Abdomen:  BS normoactive. Soft, Nondistended, non-tender.  L CVA tenderness.  Extremities: No pretibial edema.     ASSESSMENT/ PLAN: Acute pyelonephritis - Febrile overnight to 102.75F. WBC trending down slightly overnight, however likely dilutional given its concordance with other cell lines (WBC 16.5 => 13.0). Pt's urine culture grew E. Coli, with sensitivities pending. Although local E. Coli resistance patterns to ceftriaxone are low, it is surprising how limited the patient's clinical improvement has been to this point as she has now received 2 days of IV ceftriaxone. - cont IVF - d/c ceftriaxone => start IV imipenem (due to lower E. Coli resistance rates than with ceftriaxone) - dilaudid 1-2mg  q2h PRN - zofran PRN - tylenol PRN - blood cultures x 2   Length of Stay: 1   Wanda Morales R  06/14/2012, 10:26 AM

## 2012-06-14 NOTE — H&P (Signed)
Internal Medicine Teaching Service Attending Note Date: 06/14/2012  Patient name: Wanda Morales  Medical record number: [id number]  Date of birth: 10/09/1990   I have seen and evaluated Wanda Morales and discussed their care with the Residency Team.    Wanda Morales is a 22yo AA woman who presented due to low back pain, worse on the left which began 3 days PTA.  She also noted fevers at home and some mild dysuria along with increased urinary frequency and foul smelling urine.  She has a history of frequent UTI's.  She presented previously to the ED and was given a dose of Rocephin and an Rx for bactrim which she did not fill.  They obtained a CT scan of the abdomen in the ED which revealed pyelonephritis of the left kidney.  Her symptoms have caused some nausea.  She has never had pyelonephritis per her knowledge.  She is currently sexually active with one boyfriend, but they have only recently started seeing each other.   For further PMH, PSH, meds, allergies and ROS, please see resident note.   Physical Exam: Blood pressure 110/63, pulse 86, temperature 99.2 F (37.3 C), temperature source Oral, resp. rate 18, height 5\' 6"  (1.676 m), weight 146 lb (66.225 kg), last menstrual period 06/02/2012, SpO2 99.00%. BP 110/63  Pulse 88  Temp(Src) 99.9 F (37.7 C) (Oral)  Resp 20  Ht 5\' 6"  (1.676 m)  Wt 146 lb (66.225 kg)  BMI 23.58 kg/m2  SpO2 98%  LMP 06/02/2012 General appearance: alert, cooperative and appears stated age Head: Normocephalic, without obvious abnormality, atraumatic Eyes: EOMI, anicteric sclerae Lungs: clear to auscultation bilaterally and no wheeze Heart: RR, NR, no murmur Abdomen: Normoactive bowel sounds, ttp diffusely which is mild, L CVA tenderness, belly button ring Extremities: thin, no edema Pulses: 2+ and symmetric  Lab results: Results for orders placed during the hospital encounter of 06/13/12 (from the past 24 hour(s))  BASIC METABOLIC PANEL     Status:  Abnormal   Collection Time    06/14/12  5:37 AM      Result Value Range   Sodium 138  135 - 145 mEq/L   Potassium 3.4 (*) 3.5 - 5.1 mEq/L   Chloride 104  96 - 112 mEq/L   CO2 24  19 - 32 mEq/L   Glucose, Bld 102 (*) 70 - 99 mg/dL   BUN 3 (*) 6 - 23 mg/dL   Creatinine, Ser 4.09  0.50 - 1.10 mg/dL   Calcium 8.6  8.4 - 81.1 mg/dL   GFR calc non Af Amer >90  >90 mL/min   GFR calc Af Amer >90  >90 mL/min  CBC     Status: Abnormal   Collection Time    06/14/12  5:37 AM      Result Value Range   WBC 13.0 (*) 4.0 - 10.5 K/uL   RBC 3.80 (*) 3.87 - 5.11 MIL/uL   Hemoglobin 11.7 (*) 12.0 - 15.0 g/dL   HCT 91.4 (*) 78.2 - 95.6 %   MCV 89.5  78.0 - 100.0 fL   MCH 30.8  26.0 - 34.0 pg   MCHC 34.4  30.0 - 36.0 g/dL   RDW 21.3  08.6 - 57.8 %   Platelets 184  150 - 400 K/uL    Imaging results:  Dg Chest 2 View  06/12/2012  *RADIOLOGY REPORT*  Clinical Data: Chest pain and shortness of breath.  CHEST - 2 VIEW  Comparison: None  Findings: The cardiomediastinal silhouette  is unremarkable. The lungs are clear. There is no evidence of focal airspace disease, pulmonary edema, suspicious pulmonary nodule/mass, pleural effusion, or pneumothorax. No acute bony abnormalities are identified.  IMPRESSION: No evidence of active cardiopulmonary disease.   Original Report Authenticated By: Harmon Pier, M.D.    Ct Abdomen Pelvis W Contrast  06/12/2012  *RADIOLOGY REPORT*  Clinical Data: Lower abdominal pain.  Periumbilical pain.  Nausea and vomiting.  Fever.  CT ABDOMEN AND PELVIS WITH CONTRAST  Technique:  Multidetector CT imaging of the abdomen and pelvis was performed following the standard protocol during bolus administration of intravenous contrast.  Contrast: 80mL OMNIPAQUE IOHEXOL 300 MG/ML  SOLN  Comparison: None.  Findings: The lung bases are clear.  The liver, spleen, gallbladder, pancreas, adrenal glands, abdominal aorta, and retroperitoneal lymph nodes are unremarkable.  There is enlargement of the left  kidney with respect to the right and the left renal nephrogram is heterogeneous. The left nephrogram demonstrates focal areas of hypo enhancement and cystic change. Changes  suggest pyelonephritis.  There is no pyelocaliectasis or ureterectasis.  No stones are identified.  The stomach, small bowel, and colon are not abnormally distended.  Scattered stool in the colon.  No free air or free fluid in the abdomen.  Pelvis:  Bladder wall is not thickened.  Uterus and ovaries are not enlarged.  Small amount of free fluid in the pelvis likely representing physiologic fluid.  No evidence of diverticulitis. The appendix is normal.  Normal alignment of the lumbar vertebrae.  IMPRESSION: Enlarged left kidney with abnormal nephrogram suggesting pyelonephritis.   Original Report Authenticated By: Burman Nieves, M.D.     Assessment and Plan: I agree with the formulated Assessment and Plan with the following changes:   1. Pylonephritis due to E.coli - IV Antibiotics with Ceftriaxone, await UC results for sensitivities - Fluids and zofran as needed, advance diet as tolerated - Pain control with morphine - Also awaiting GC/Chlamydia results  Other issues as per resident note.   Inez Catalina, MD 3/17/201410:01 AM

## 2012-06-14 NOTE — Progress Notes (Signed)
Medical Student Daily Progress Note  Subjective: Wanda Morales is doing okay this morning.  She states that the pain is a little better, only a 4/10 currently.  She still complains of fever and states that the pain meds help, but that when her fever spikes the pain worsens.  She endorses diffuse pain, complaining of headache as well, and L>R flank pain.  This morning she has a little nausea but denies vomiting.  No problems urinating today.  She denies hematuria.  Objective: Vital signs in last 24 hours: Filed Vitals:   06/13/12 2053 06/14/12 0125 06/14/12 0308 06/14/12 0524  BP: 118/65   110/63  Pulse: 102   86  Temp: 99.4 F (37.4 C) 102.8 F (39.3 C) 100 F (37.8 C) 99.2 F (37.3 C)  TempSrc: Oral Oral Oral Oral  Resp: 20   18  Height:      Weight:      SpO2: 100%   99%   Weight change:   Intake/Output Summary (Last 24 hours) at 06/14/12 0924 Last data filed at 06/14/12 0300  Gross per 24 hour  Intake 1496.25 ml  Output      0 ml  Net 1496.25 ml   Physical Exam: General: resting in bed in NAD, skin warm to the touch HEENT: PERRL, EOMI, no scleral icterus Cardiac: RRR, no rubs, murmurs or gallops Pulm: clear to auscultation bilaterally, moving normal volumes of air Abd: soft, nontender, nondistended, BS present, + left flank TTP Ext: warm and well perfused, no pedal edema Neuro: alert and oriented X3, cranial nerves II-XII grossly intact  Lab Results: CBC    Component Value Date/Time   WBC 13.0* 06/14/2012 0537   RBC 3.80* 06/14/2012 0537   HGB 11.7* 06/14/2012 0537   HCT 34.0* 06/14/2012 0537   PLT 184 06/14/2012 0537   MCV 89.5 06/14/2012 0537   MCH 30.8 06/14/2012 0537   MCHC 34.4 06/14/2012 0537   RDW 13.2 06/14/2012 0537   LYMPHSABS 1.0 06/12/2012 1208   MONOABS 1.3* 06/12/2012 1208   EOSABS 0.0 06/12/2012 1208   BASOSABS 0.0 06/12/2012 1208   BMET    Component Value Date/Time   NA 138 06/14/2012 0537   K 3.4* 06/14/2012 0537   CL 104 06/14/2012 0537   CO2 24  06/14/2012 0537   GLUCOSE 102* 06/14/2012 0537   BUN 3* 06/14/2012 0537   CREATININE 0.75 06/14/2012 0537   CALCIUM 8.6 06/14/2012 0537   GFRNONAA >90 06/14/2012 0537   GFRAA >90 06/14/2012 0537      Micro Results: Recent Results (from the past 240 hour(s))  URINE CULTURE     Status: None   Collection Time    06/12/12  1:25 PM      Result Value Range Status   Specimen Description URINE, CLEAN CATCH   Final   Special Requests NONE   Final   Culture  Setup Time 06/12/2012 21:35   Final   Colony Count >=100,000 COLONIES/ML   Final   Culture ESCHERICHIA COLI   Final   Report Status PENDING   Incomplete   Studies/Results: Dg Chest 2 View  06/12/2012  *RADIOLOGY REPORT*  Clinical Data: Chest pain and shortness of breath.  CHEST - 2 VIEW  Comparison: None  Findings: The cardiomediastinal silhouette is unremarkable. The lungs are clear. There is no evidence of focal airspace disease, pulmonary edema, suspicious pulmonary nodule/mass, pleural effusion, or pneumothorax. No acute bony abnormalities are identified.  IMPRESSION: No evidence of active cardiopulmonary disease.  Original Report Authenticated By: Harmon Pier, M.D.    Ct Abdomen Pelvis W Contrast  06/12/2012  *RADIOLOGY REPORT*  Clinical Data: Lower abdominal pain.  Periumbilical pain.  Nausea and vomiting.  Fever.  CT ABDOMEN AND PELVIS WITH CONTRAST  Technique:  Multidetector CT imaging of the abdomen and pelvis was performed following the standard protocol during bolus administration of intravenous contrast.  Contrast: 80mL OMNIPAQUE IOHEXOL 300 MG/ML  SOLN  Comparison: None.  Findings: The lung bases are clear.  The liver, spleen, gallbladder, pancreas, adrenal glands, abdominal aorta, and retroperitoneal lymph nodes are unremarkable.  There is enlargement of the left kidney with respect to the right and the left renal nephrogram is heterogeneous. The left nephrogram demonstrates focal areas of hypo enhancement and cystic change. Changes   suggest pyelonephritis.  There is no pyelocaliectasis or ureterectasis.  No stones are identified.  The stomach, small bowel, and colon are not abnormally distended.  Scattered stool in the colon.  No free air or free fluid in the abdomen.  Pelvis:  Bladder wall is not thickened.  Uterus and ovaries are not enlarged.  Small amount of free fluid in the pelvis likely representing physiologic fluid.  No evidence of diverticulitis. The appendix is normal.  Normal alignment of the lumbar vertebrae.  IMPRESSION: Enlarged left kidney with abnormal nephrogram suggesting pyelonephritis.   Original Report Authenticated By: Burman Nieves, M.D.    Medications: I have reviewed the patient's current medications. Scheduled Meds: . cefTRIAXone (ROCEPHIN)  IV  1 g Intravenous Q24H  . enoxaparin (LOVENOX) injection  40 mg Subcutaneous Q24H  . influenza  inactive virus vaccine  0.5 mL Intramuscular Tomorrow-1000  . potassium chloride  40 mEq Oral Once   Continuous Infusions: . sodium chloride 75 mL/hr at 06/14/12 0037   PRN Meds:.acetaminophen, acetaminophen, HYDROmorphone (DILAUDID) injection, ondansetron (ZOFRAN) IV, ondansetron  Assessment/Plan: Wanda Morales is a previously healthy 22yo female with no pertinent PMHx admitted on 06/14/12 for left sided flank pain, mild dysuria, fever and chills for the past 3 days. She is improving slightly but still in moderate discomfort.   Active Problems:  Pyelonephritis, acute: Pt admitted for pain management and IV antibiotics for pyelonephritis. 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 CT scan suggested left sided pyelonephritis. Her pain is better controlled but she still is having fevers (up to 102.8 degrees) and breakthrough pain.  Urine culture grew E. Coli but sensitivities are still pending. -continue IV NS fluids 72mL/hr  -continue IV ceftriaxone; consider antibiotic switch to imipenem/cilastatin if symptoms do not improve soon -urine culture grew  E. Coli but follow up sensitivities -f/u gc/chlamydia results -IV hydromorphone PRN for pain (IV morphine not strong enough)  -zofran PRN for nausea -tylenol PRN for fever  Anemia: Hemoglobin of 14.6 on admission, 11.7 today.  At presentation, patient was very dehydrated and she has received maintanence fluids since admission.  No baseline to compare to.  Hemoglobin drop likely dilutional. -CBC in AM to recheck    LOS: 1 day   This is a Psychologist, occupational Note.  The care of the patient was discussed with Dr. Lavena Bullion and the assessment and plan formulated with their assistance.  Please see their attached note for official documentation of the daily encounter.  Esau Grew 06/14/2012, 9:24 AM

## 2012-06-15 ENCOUNTER — Encounter: Payer: Self-pay | Admitting: Internal Medicine

## 2012-06-15 LAB — CBC
HCT: 34 % — ABNORMAL LOW (ref 36.0–46.0)
HCT: 33.1 % — ABNORMAL LOW (ref 36.0–46.0)
Hemoglobin: 11.7 g/dL — ABNORMAL LOW (ref 12.0–15.0)
Hemoglobin: 11.5 g/dL — ABNORMAL LOW (ref 12.0–15.0)
MCH: 30.8 pg (ref 26.0–34.0)
MCH: 31.7 pg (ref 26.0–34.0)
MCHC: 34.4 g/dL (ref 30.0–36.0)
MCHC: 34.7 g/dL (ref 30.0–36.0)
MCV: 89.5 fL (ref 78.0–100.0)
Platelets: 184 K/uL (ref 150–400)
RBC: 3.8 MIL/uL — ABNORMAL LOW (ref 3.87–5.11)
RBC: 3.63 MIL/uL — ABNORMAL LOW (ref 3.87–5.11)
RDW: 13.2 % (ref 11.5–15.5)
WBC: 13 K/uL — ABNORMAL HIGH (ref 4.0–10.5)

## 2012-06-15 LAB — BASIC METABOLIC PANEL
BUN: 4 mg/dL — ABNORMAL LOW (ref 6–23)
CO2: 25 mEq/L (ref 19–32)
Glucose, Bld: 97 mg/dL (ref 70–99)
Potassium: 3.7 mEq/L (ref 3.5–5.1)
Sodium: 139 mEq/L (ref 135–145)

## 2012-06-15 LAB — URINE CULTURE

## 2012-06-15 MED ORDER — CIPROFLOXACIN HCL 500 MG PO TABS: 500.0000 mg | ORAL_TABLET | Freq: Two times a day (BID) | ORAL | Status: DC

## 2012-06-15 NOTE — Progress Notes (Signed)
Medical Student Daily Progress Note  Subjective: Wanda Morales is significantly improved this morning and feels ready to go home.  Her pain is only a 2-3/10 localized to L flank.  She is having less fever and chills.  Denies n/v.  Objective: Vital signs in last 24 hours: Filed Vitals:   06/14/12 1836 06/14/12 2042 06/15/12 0503 06/15/12 0913  BP: 114/68 116/69 114/76 111/69  Pulse: 78 72 90 80  Temp: 98.8 F (37.1 C) 98.8 F (37.1 C) 99 F (37.2 C) 98.6 F (37 C)  TempSrc: Oral Oral Axillary Oral  Resp: 20 20 20 20   Height:  5\' 6"  (1.676 m)    Weight:  63.912 kg (140 lb 14.4 oz)    SpO2: 98% 100% 99% 98%   Weight change: -2.313 kg (-5 lb 1.6 oz)  Intake/Output Summary (Last 24 hours) at 06/15/12 1109 Last data filed at 06/15/12 0900  Gross per 24 hour  Intake 3039.17 ml  Output      6 ml  Net 3033.17 ml   Physical Exam: General: resting in bed in NAD HEENT: PERRL, EOMI, no scleral icterus Cardiac: RRR, no rubs, murmurs or gallops Pulm: clear to auscultation bilaterally, moving normal volumes of air Abd: soft, nontender, nondistended, BS present, + mild left flank TTP Ext: warm and well perfused, no pedal edema Neuro: alert and oriented X3, cranial nerves II-XII grossly intact  Lab Results: CBC    Component Value Date/Time   WBC 13.0* 06/14/2012 0537   RBC 3.80* 06/14/2012 0537   HGB 11.7* 06/14/2012 0537   HCT 34.0* 06/14/2012 0537   PLT 184 06/14/2012 0537   MCV 89.5 06/14/2012 0537   MCH 30.8 06/14/2012 0537   MCHC 34.4 06/14/2012 0537   RDW 13.2 06/14/2012 0537   LYMPHSABS 1.0 06/12/2012 1208   MONOABS 1.3* 06/12/2012 1208   EOSABS 0.0 06/12/2012 1208   BASOSABS 0.0 06/12/2012 1208   BMET    Component Value Date/Time   NA 138 06/14/2012 0537   K 3.4* 06/14/2012 0537   CL 104 06/14/2012 0537   CO2 24 06/14/2012 0537   GLUCOSE 102* 06/14/2012 0537   BUN 3* 06/14/2012 0537   CREATININE 0.75 06/14/2012 0537   CALCIUM 8.6 06/14/2012 0537   GFRNONAA >90 06/14/2012 0537   GFRAA >90 06/14/2012 0537      Micro Results: Recent Results (from the past 240 hour(s))  URINE CULTURE     Status: None   Collection Time    06/12/12  1:25 PM      Result Value Range Status   Specimen Description URINE, CLEAN CATCH   Final   Special Requests NONE   Final   Culture  Setup Time 06/12/2012 21:35   Final   Colony Count >=100,000 COLONIES/ML   Final   Culture ESCHERICHIA COLI   Final   Report Status 06/15/2012 FINAL   Final   Organism ID, Bacteria ESCHERICHIA COLI   Final  GC/CHLAMYDIA PROBE AMP     Status: None   Collection Time    06/13/12 12:09 PM      Result Value Range Status   CT Probe RNA NEGATIVE  NEGATIVE Final   GC Probe RNA NEGATIVE  NEGATIVE Final   Comment: (NOTE)                                                                                              **  Normal Reference Range: Negative**          Assay performed using the Gen-Probe APTIMA COMBO2 (R) Assay.     Acceptable specimen types for this assay include APTIMA Swabs (Unisex,     endocervical, urethral, or vaginal), first void urine, and ThinPrep     liquid based cytology samples.  CULTURE, BLOOD (ROUTINE X 2)     Status: None   Collection Time    06/14/12 10:57 AM      Result Value Range Status   Specimen Description BLOOD RIGHT ARM   Final   Special Requests BOTTLES DRAWN AEROBIC AND ANAEROBIC 10CC   Final   Culture  Setup Time 06/14/2012 14:19   Final   Culture     Final   Value:        BLOOD CULTURE RECEIVED NO GROWTH TO DATE CULTURE WILL BE HELD FOR 5 DAYS BEFORE ISSUING A FINAL NEGATIVE REPORT   Report Status PENDING   Incomplete  CULTURE, BLOOD (ROUTINE X 2)     Status: None   Collection Time    06/14/12 11:03 AM      Result Value Range Status   Specimen Description BLOOD RIGHT HAND   Final   Special Requests BOTTLES DRAWN AEROBIC AND ANAEROBIC 10CC   Final   Culture  Setup Time 06/14/2012 14:19   Final   Culture     Final   Value:        BLOOD CULTURE RECEIVED NO GROWTH TO DATE  CULTURE WILL BE HELD FOR 5 DAYS BEFORE ISSUING A FINAL NEGATIVE REPORT   Report Status PENDING   Incomplete   Studies/Results: No results found. Medications: I have reviewed the patient's current medications. Scheduled Meds: . cefTRIAXone (ROCEPHIN)  IV  1 g Intravenous Q24H  . enoxaparin (LOVENOX) injection  40 mg Subcutaneous Q24H   Continuous Infusions: . sodium chloride 125 mL/hr at 06/15/12 0422   PRN Meds:.acetaminophen, acetaminophen, HYDROmorphone (DILAUDID) injection, ondansetron (ZOFRAN) IV, ondansetron  Assessment/Plan: Wanda Morales is a previously healthy 22yo female with no pertinent PMHx admitted on 06/14/12 for left sided flank pain, mild dysuria, fever and chills. She has improved significantly and is ready to go home.  Active Problems:  Pyelonephritis, acute: Pt admitted for pain management and IV antibiotics for pyelonephritis. 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 CT scan suggested left sided pyelonephritis.  Her pain is well controlled with minimal medications.  Urine culture Morales E. Coli sensitive to cipro and ceftriaxone, resistant to bactrim.    -discontinue fluids, eating normal diet -gc/chlamydia  Negative -give 1 more dose of IV ceftriaxone and discharge on oral cipro given hx of recurrent UTIs and severe pyelonephritis.  Discussed with patient the importance of completing the antibiotic course and informing future providers that she has a hx of bactrim resistant UTIs. -prepare to discharge today  Anemia: Hemoglobin of 14.6 on admission, 11.7 today.  At presentation, patient was very dehydrated and she has received maintanence fluids since admission.  No baseline to compare to.  Hemoglobin drop likely dilutional. -follow up CBC results    LOS: 2 days   This is a Psychologist, occupational Note.  The care of the patient was discussed with Dr. Lavena Bullion and the assessment and plan formulated with their assistance.  Please see their attached note for  official documentation of the daily encounter.  Wanda Morales 06/15/2012, 11:09 AM

## 2012-06-15 NOTE — Discharge Summary (Signed)
Internal Medicine Teaching [facility] Discharge Note  Name: Wanda Morales MRN: [id number] DOB: [birth year] 22 y.o.  Date of Admission: 06/13/2012  7:16 AM Date of Discharge: 06/15/2012 Attending Physician: Aletta Edouard, MD  Discharge Diagnosis: Active Problems:   Pyelonephritis, acute  Discharge Medications:   Medication List    STOP taking these medications       sulfamethoxazole-trimethoprim 800-160 MG per tablet  Commonly known as:  SEPTRA DS      TAKE these medications       BIOTIN PO  Take 1 capsule by mouth daily.     ciprofloxacin 500 MG tablet  Commonly known as:  CIPRO  Take 1 tablet (500 mg total) by mouth 2 (two) times daily. One po bid x 7 days     ibuprofen 200 MG tablet  Commonly known as:  ADVIL,MOTRIN  Take 800 mg by mouth daily as needed for pain. For pain     ondansetron 8 MG disintegrating tablet  Commonly known as:  ZOFRAN-ODT  Take 8 mg by mouth every 4 (four) hours as needed. 8mg  ODT q4 hours prn nausea        Disposition and follow-up:   Ms.Wanda Morales was discharged from [facility] in Good condition.  At the hospital follow up visit please address  1. Resolution of symptoms.  Follow-up Appointments:     Follow-up Information   [facility] Schedule an appointment as soon as possible for a visit in 2 weeks. (Make appointment sooner if you clinically worsen)      Discharge Orders   Future Orders Complete By Expires     Call MD for:  difficulty breathing, headache or visual disturbances  As directed     Call MD for:  extreme fatigue  As directed     Call MD for:  persistant dizziness or light-headedness  As directed     Call MD for:  persistant nausea and vomiting  As directed     Call MD for:  severe uncontrolled pain  As directed     Diet - low sodium heart healthy  As directed     Discharge instructions  As directed     Comments:      -You were hospitalized and treated for  pyelonephritis - or a kidney infection.  You received 4 doses of intravenous antibiotics, and you will need to complete 7 more days of oral antibiotics.  Please take Ciprofloxacin 500mg  twice daily for 7 days.  Do not stop medication even if you feel better.  One of the rare side effects of this antibiotic is tendon rupture, so if you experience new, acute onset musculoskeletal pain (especially of your achilles tendon), please seek medical attention as soon as possible.  To note, the bug in you urine was resistant to the antibiotic called Bactrim, which would be useful to tell your future providers if you have another UTI.  Also, be sure to urinate after intercourse, as this will decrease your chances of having another UTI.  -You may take tylenol if you develop a fever or for pain management.  Call the doctor if your fever is over 102, or if you have recurrent fevers.    Increase activity slowly  As directed        Consultations:  None  Procedures Performed:  Dg Chest 2 View 06/12/2012   IMPRESSION: No evidence of active cardiopulmonary disease.     Ct Abdomen Pelvis W Contrast 06/12/2012  *RADIOLOGY  REPORT*  Clinical Data: Lower abdominal pain.  Periumbilical pain.  Nausea and vomiting.  Fever.  CT ABDOMEN AND PELVIS WITH CONTRAST  Technique:  Multidetector CT imaging of the abdomen and pelvis was performed following the standard protocol during bolus administration of intravenous contrast.  Contrast: 80mL OMNIPAQUE IOHEXOL 300 MG/ML  SOLN  Comparison: None.  Findings: The lung bases are clear.  The liver, spleen, gallbladder, pancreas, adrenal glands, abdominal aorta, and retroperitoneal lymph nodes are unremarkable.  There is enlargement of the left kidney with respect to the right and the left renal nephrogram is heterogeneous. The left nephrogram demonstrates focal areas of hypo enhancement and cystic change. Changes  suggest pyelonephritis.  There is no pyelocaliectasis or ureterectasis.  No stones  are identified.  The stomach, small bowel, and colon are not abnormally distended.  Scattered stool in the colon.  No free air or free fluid in the abdomen.  Pelvis:  Bladder wall is not thickened.  Uterus and ovaries are not enlarged.  Small amount of free fluid in the pelvis likely representing physiologic fluid.  No evidence of diverticulitis. The appendix is normal.  Normal alignment of the lumbar vertebrae.  IMPRESSION: Enlarged left kidney with abnormal nephrogram suggesting pyelonephritis.   Original Report Authenticated By: Burman Nieves, M.D.    Admission HPI:  Patient is a 22 y.o. female with PMH of previous UTIs, who presents to [facility] for evaluation of fever and L flank pain. The patient states the symptoms started approximately 3 days prior to admission. She has been nauseated and has had one episode of emesis since symptoms began. She also has noted some foul-smelling urine during that same period of time, but denies dysuria or hematuria. She presented to the ED yesterday with these complaints, and was discharged home with a prescription for Bactrim after receiving one dose of IV ceftriaxone in the ED. The patient states that she returned to the ED today as her symptoms were worsening. She feels that her fever is now worse than it was previously, and her left flank pain persists.  The patient has a history of previous UTIs, with her most recent being in 10/2011, at which time she states that she had a fever but denied having any flank pain with his previous episode. She has no history of pyelonephritis to her knowledge. Risk factors for development of urinary tract infections in this patient include recent sexual activity.  Vital Signs:  Blood pressure 107/63, pulse 101, temperature 101.8 F (38.8 C), temperature source Oral, resp. rate 18, last menstrual period 06/02/2012, SpO2 97.00%.  Physical Exam:  General:  Vital signs reviewed and noted. In mild distress 2/2 pain.   Head:   Normocephalic, atraumatic.   Eyes:  PERRL, EOMI, No signs of anemia or jaundice.   Neck:  No deformities, masses, or tenderness noted..   Lungs:  Normal respiratory effort. Clear to auscultation BL without crackles or wheezes.   Heart:  RRR. S1 and S2 normal without gallop, murmur, or rubs.   Abdomen:  BS normoactive. Soft, Nondistended, non-tender. L CVA tenderness.   Extremities:  No pretibial edema.   Neurologic:  A&O X3, CN II - XII are grossly intact. Motor strength is 5/5 in the all 4 extremities, Sensations intact to light touch, Cerebellar signs negative.   Skin:  No visible rashes, scars.   Lab results:  Comprehensive Metabolic Panel:    Component  Value  Date/Time    NA  135  06/12/2012 1236    K  3.6  06/12/2012 1236    CL  101  06/12/2012 1236    BUN  8  06/12/2012 1236    CREATININE  0.80  06/12/2012 1236    GLUCOSE  117*  06/12/2012 1236    CBC:    Component  Value  Date/Time    WBC  16.5*  06/12/2012 1208    HGB  14.6  06/12/2012 1236    HCT  43.0  06/12/2012 1236    PLT  200  06/12/2012 1208    MCV  87.5  06/12/2012 1208    NEUTROABS  14.2*  06/12/2012 1208    LYMPHSABS  1.0  06/12/2012 1208    MONOABS  1.3*  06/12/2012 1208    EOSABS  0.0  06/12/2012 1208    BASOSABS  0.0  06/12/2012 1208    Urinalysis:   Recent Labs   06/12/12 1325   COLORURINE  AMBER*   LABSPEC  1.025   PHURINE  7.0   GLUCOSEU  NEGATIVE   HGBUR  MODERATE*   BILIRUBINUR  NEGATIVE   KETONESUR  40*   PROTEINUR  100*   UROBILINOGEN  1.0   NITRITE  POSITIVE*   LEUKOCYTESUR  MODERATE*    Troponin (Point of Care Test)   Recent Labs   06/12/12 1234   TROPIPOC  0.01     Hospital Course by problem list: 22 yo F without significant past medical history.  Acute pyelonephritis - Received 4 doses of Ceftriaxone during hospitalization. Tmax in last 24h was 101.5, but patient significantly improved clinically.  Patient to continue 7 more days of Cipro 500po BID starting 06/16/12 (based on sensitivities  of urine culture).  No IV pain medication since night prior to discharge.  Normocytic Anemia: Stable during hospitalization (14.6 at admission, likely hemoconcentrated, baseline likely ~11.5).  No baseline on file, but patient asymptomatic. Not unusual in menstruating female. Continue to monitor in outpatient setting.   Discharge Vitals:  BP 111/69  Pulse 80  Temp(Src) 98.6 F (37 C) (Oral)  Resp 20  Ht 5\' 6"  (1.676 m)  Wt 140 lb 14.4 oz (63.912 kg)  BMI 22.75 kg/m2  SpO2 98%  LMP 06/02/2012  Discharge Labs:  Results for orders placed during the hospital encounter of 06/13/12 (from the past 24 hour(s))  CBC     Status: Abnormal   Collection Time    06/15/12 11:47 AM      Result Value Range   WBC 6.6  4.0 - 10.5 K/uL   RBC 3.63 (*) 3.87 - 5.11 MIL/uL   Hemoglobin 11.5 (*) 12.0 - 15.0 g/dL   HCT 40.9 (*) 81.1 - 91.4 %   MCV 91.2  78.0 - 100.0 fL   MCH 31.7  26.0 - 34.0 pg   MCHC 34.7  30.0 - 36.0 g/dL   RDW 78.2  95.6 - 21.3 %   Platelets 216  150 - 400 K/uL  BASIC METABOLIC PANEL     Status: Abnormal   Collection Time    06/15/12 11:47 AM      Result Value Range   Sodium 139  135 - 145 mEq/L   Potassium 3.7  3.5 - 5.1 mEq/L   Chloride 106  96 - 112 mEq/L   CO2 25  19 - 32 mEq/L   Glucose, Bld 97  70 - 99 mg/dL   BUN 4 (*) 6 - 23 mg/dL   Creatinine, Ser 0.86  0.50 - 1.10 mg/dL   Calcium 8.6  8.4 - 10.5 mg/dL   GFR calc non Af Amer >90  >90 mL/min   GFR calc Af Amer >90  >90 mL/min    Signed: Nayla Dias 06/15/2012, 11:45 AM   Time Spent on Discharge: 25 min

## 2012-06-15 NOTE — Progress Notes (Signed)
Resident Addendum to Medical Student Note   I have seen and examined the patient, and agree with the the medical student assessment and plan outlined above. Please see my brief note below for additional details.  S: Patient feeling much better today. L flank pain significantly improved, and less severe subjective fevers. Tolerating diet without incident.   OBJECTIVE: VS: Reviewed  Meds: Reviewed  Labs: Reviewed  Imaging: Reviewed   Physical Exam: General: Vital signs reviewed and noted. Well-developed, well-nourished, in no acute distress; alert, appropriate and cooperative throughout examination.  HEENT: Normocephalic, atraumatic  Lungs:  Normal respiratory effort. Clear to auscultation BL without crackles or wheezes.  Heart: RRR. S1 and S2 normal without gallop, murmur, or rubs.  Abdomen:  BS normoactive. Soft, Nondistended, non-tender.  No masses or organomegaly.  Extremities: No pretibial edema.     ASSESSMENT/ PLAN: Acute pyelonephritis - d3 of ceftriaxone. Yesterday's plans to switch to imipenem were not implemented. Sensitivities this AM reveal E. Coli resistant only to TMP-SMX and ampicillin. As patient is much improved, she will be discharged home today on oral ciprofloxacin with instructions to complete a total of a 10day antibiotic course.   - IV ceftriaxone => ciprofloxacin  - discharge home   Length of Stay: 2   Teoman Giraud R  06/15/2012, 11:34 AM

## 2012-06-16 NOTE — Discharge Summary (Signed)
I saw Ms. Pozzi on day of discharge and formulated the plan with the residents.

## 2012-06-20 LAB — CULTURE, BLOOD (ROUTINE X 2): Culture: NO GROWTH

## 2014-12-27 IMAGING — CR DG CHEST 2V
2 series · 2 of 2 positions shown · non-contrast
Comparison: None

CLINICAL DATA: Chest pain and shortness of breath.

CHEST - 2 VIEW

[w chest pa]
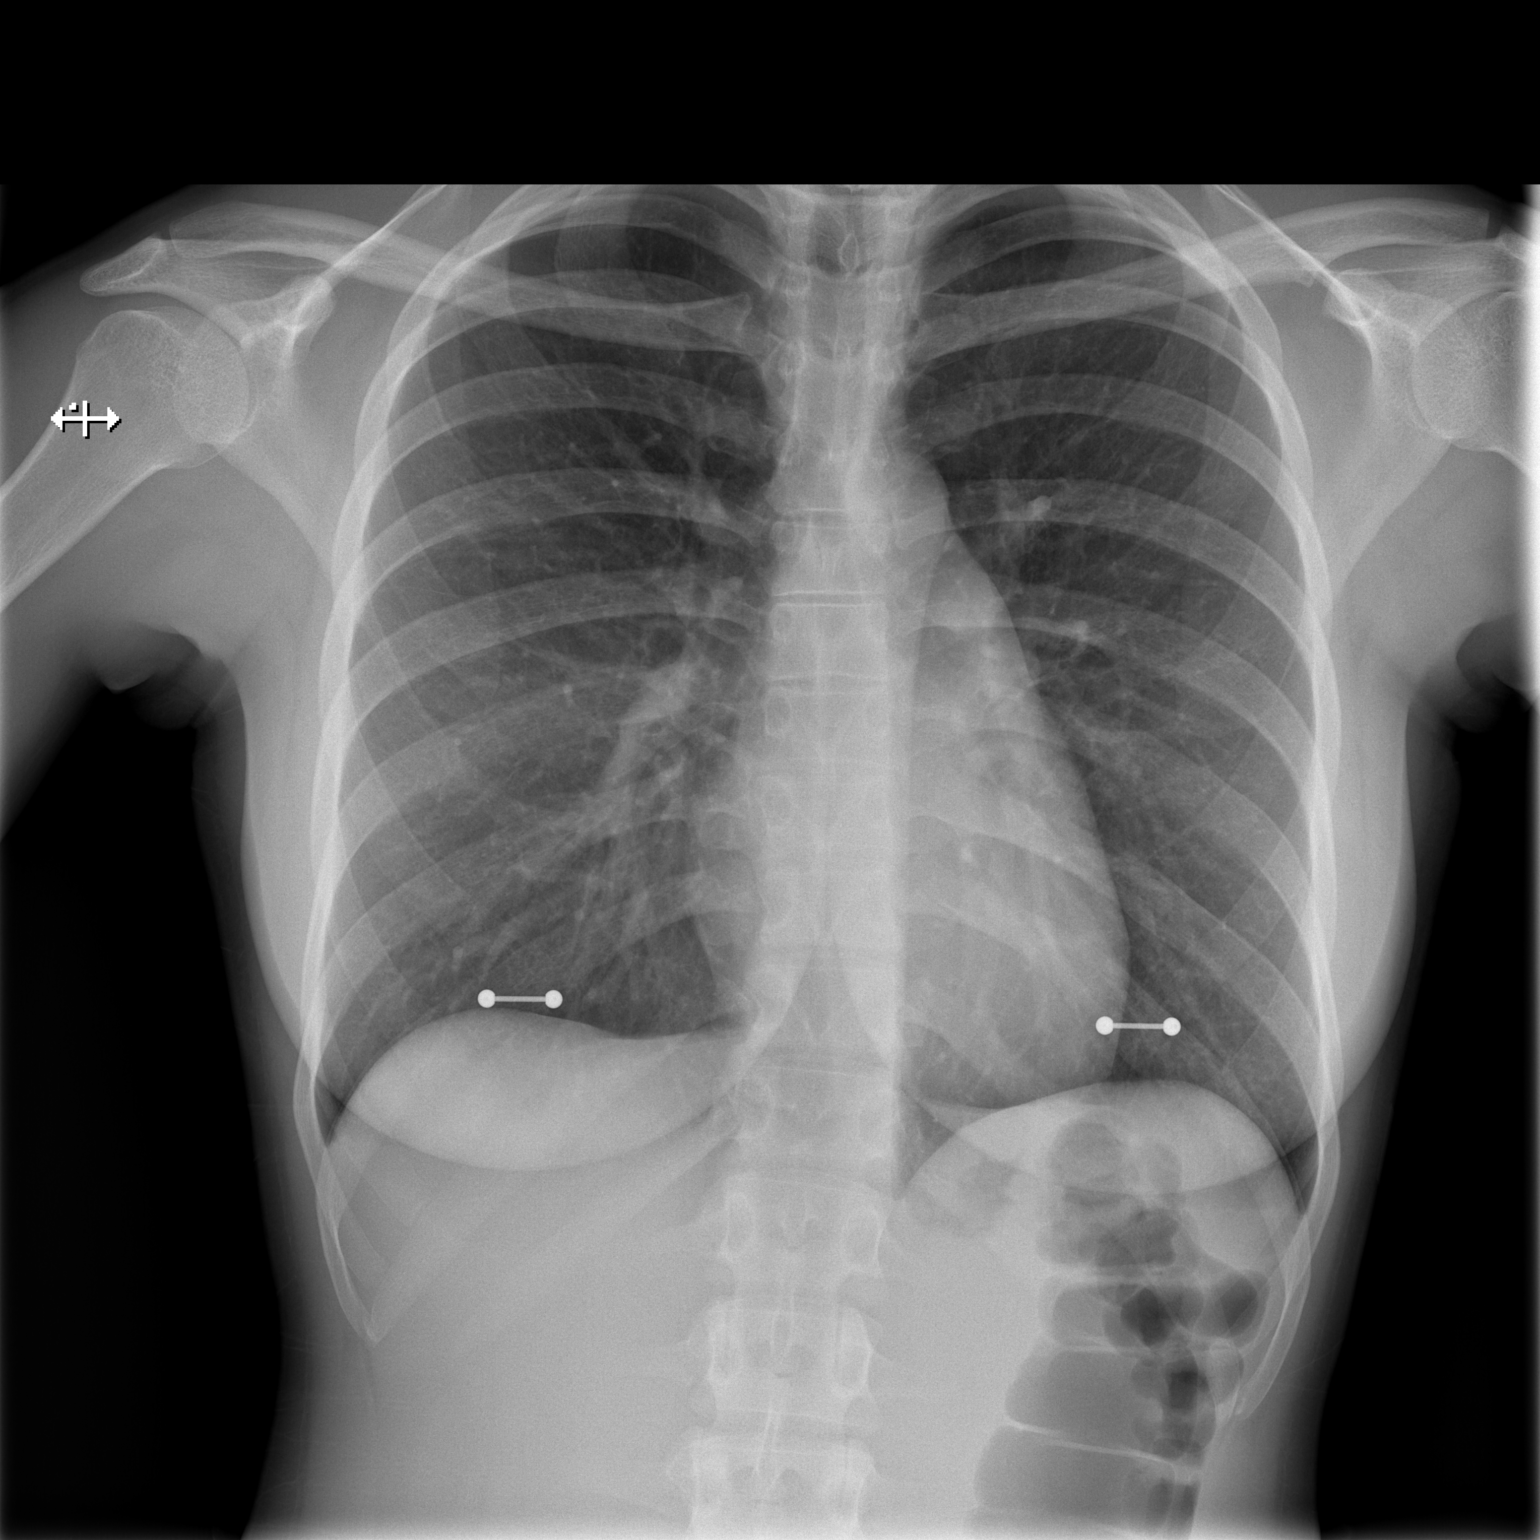

[w chest lat]
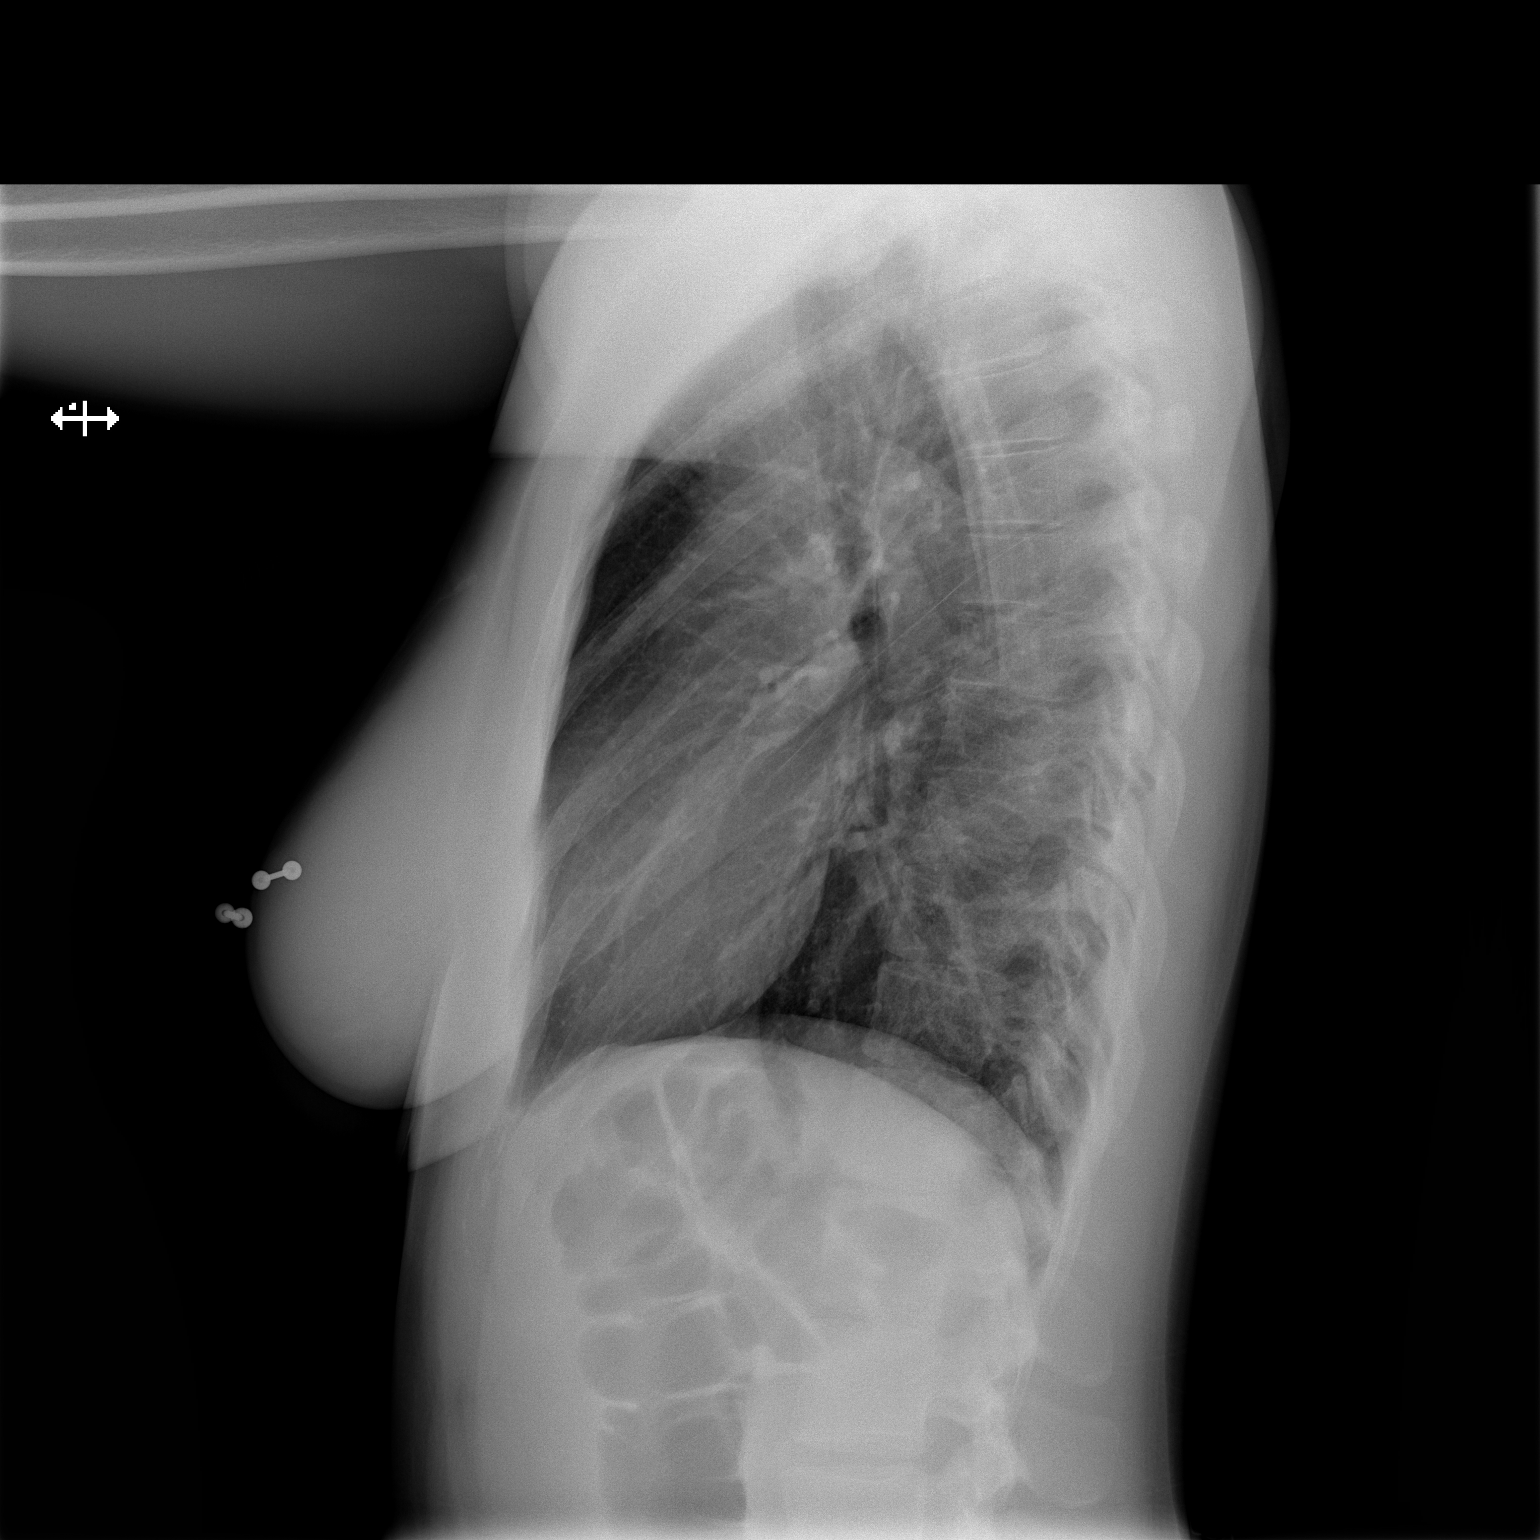

[2 of 2 positions shown; findings below may reference images not displayed]

FINDINGS: The cardiomediastinal silhouette is unremarkable.
The lungs are clear.
There is no evidence of focal airspace disease, pulmonary edema,
suspicious pulmonary nodule/mass, pleural effusion, or
pneumothorax.
No acute bony abnormalities are identified.
IMPRESSION: No evidence of active cardiopulmonary disease.

## 2016-10-09 ENCOUNTER — Encounter: Payer: Self-pay | Admitting: Emergency Medicine

## 2016-10-09 ENCOUNTER — Emergency Department
Admission: EM | Admit: 2016-10-09 | Discharge: 2016-10-09 | Disposition: A | Discharge status: Home / Self Care | Payer: Self-pay | Attending: Emergency Medicine | Admitting: Emergency Medicine

## 2016-10-09 DIAGNOSIS — Z3A00 Weeks of gestation of pregnancy not specified: Secondary | ICD-10-CM | POA: Insufficient documentation

## 2016-10-09 DIAGNOSIS — B370 Candidal stomatitis: Principal | ICD-10-CM | POA: Insufficient documentation

## 2016-10-09 DIAGNOSIS — Z3A01 Less than 8 weeks gestation of pregnancy: Secondary | ICD-10-CM

## 2016-10-09 DIAGNOSIS — Z331 Pregnant state, incidental: Secondary | ICD-10-CM | POA: Insufficient documentation

## 2016-10-09 LAB — CBC WITH DIFFERENTIAL
BASOPHILS % AUTO: 0.8 %
BASOPHILS ABS AUTO: 0.1 10*3/uL (ref 0.0–0.2)
EOSINOPHIL % AUTO: 1.1 %
EOSINOPHIL ABS AUTO: 0.1 10*3/uL (ref 0.0–0.5)
HEMATOCRIT: 39.6 % (ref 34.0–46.0)
HEMOGLOBIN: 13.4 g/dL (ref 12.0–16.0)
LYMPHOCYTE ABS AUTO: 1.7 10*3/uL (ref 1.0–4.8)
LYMPHOCYTES % AUTO: 26.7 %
MCH: 31.6 pg (ref 27.0–33.0)
MCHC: 33.9 % (ref 32.0–36.0)
MCV: 93.2 UM3 (ref 80.0–100.0)
MONOCYTES % AUTO: 8.6 %
MONOCYTES ABS AUTO: 0.5 10*3/uL (ref 0.1–0.8)
MPV: 7.6 UM3 (ref 6.8–10.0)
NEUTROPHIL ABS AUTO: 4 10*3/uL (ref 1.8–7.7)
NEUTROPHILS % AUTO: 62.8 %
PLATELET COUNT: 213 10*3/uL (ref 130–400)
RDW: 12.8 % (ref 0.0–14.7)
Red Blood Cell Count: 4.25 10*6/uL (ref 3.70–5.50)
WHITE BLOOD CELL COUNT: 6.4 10*3/uL (ref 4.5–11.0)

## 2016-10-09 LAB — BASIC METABOLIC PANEL
CALCIUM: 8.9 mg/dL (ref 8.6–10.5)
CHLORIDE: 105 mmol/L (ref 95–110)
CREATININE BLOOD: 0.87 mg/dL (ref 0.44–1.27)
Carbon Dioxide Total: 21 mmol/L — ABNORMAL LOW (ref 24–32)
GLUCOSE: 86 mg/dL (ref 70–99)
POTASSIUM: 4 mmol/L (ref 3.3–5.0)
SODIUM: 135 mmol/L (ref 135–145)
Urea Nitrogen, Blood (BUN): 12 mg/dL (ref 8–22)

## 2016-10-09 LAB — URINALYSIS-COMPLETE
BILIRUBIN URINE: NEGATIVE
GLUCOSE URINE: NEGATIVE mg/dL
KETONES: 20 mg/dL — AB
LEUK. ESTERASE: NEGATIVE
NITRITE URINE: NEGATIVE
OCCULT BLOOD URINE: NEGATIVE mg/dL
PH URINE: 7 (ref 4.8–7.8)
PROTEIN URINE: NEGATIVE mg/dL
SPECIFIC GRAVITY, URINE: 1.021 (ref 1.002–1.030)
UROBILINOGEN.: 4 mg/dL — AB (ref ?–2.0)

## 2016-10-09 LAB — BETA HCG QUANT, PREGNANCY: BETA HCG QUANT, PREGNANCY: 2967.8 m[IU]/mL — AB (ref 0.0–0.6)

## 2016-10-09 NOTE — ED Initial Note (Signed)
EMERGENCY DEPARTMENT PHYSICIAN NOTE - Debbie Freeman       Date of Service:   10/09/2016 11:15 AM Patient's PCP: No Pcp Per Patient   Note Started: 10/09/2016 12:37 DOB: 07/18/1990             Chief Complaint   Patient presents with    Pregnancy Problem     The history provided by the patient.  Interpreter used: No    Debbie Freeman is a 26yr old female, who has no significant past medical history, presenting to the ED with a chief complaint of early pregnancy and oral thrush.  Patient is recently moved to [facility] and does not have a primary care doctor here.  She is concerned that her UPT was negative and her beta-hCG was 150 approximately 5 days ago.  Denies any other medical complaints at this time.       A full history, including past medical, social, and family history (as detailed in this note), was reviewed and updated as necessary.      HISTORY:  Past Medical History   Diagnosis    NO SIGNIFICANT HISTORY    No Known Allergies   Past Surgical History:  No date: No surgical history No current outpatient prescriptions on file.   Social History    Marital status: SINGLE              Spouse name:                       Years of education:                 Number of children:               Occupational History    None on file    Social History Main Topics    Smoking status: Never Smoker                                                                Smokeless status: Never Used                        Alcohol use: No              Drug use: No              Sexual activity: Not on file          Other Topics            Concern    None on file    Social History Narrative    None on file     No family history on file.     Review of Systems   Constitutional: Negative.    HENT: Positive for trouble swallowing.    Eyes: Negative.    Respiratory: Negative.    Cardiovascular: Negative.    Gastrointestinal: Negative.    Endocrine: Negative.    Genitourinary: Negative.    Musculoskeletal: Negative.    Allergic/Immunologic:  Negative.    Neurological: Negative.    Hematological: Negative.    Psychiatric/Behavioral: Negative.    All other systems reviewed and are negative.      TRIAGE VITAL SIGNS:  Temp: 37.4 C (99.3 F) (10/09/16 1128)  Temp src: (not recorded)  Pulse: 99 (10/09/16 1128)  BP: 133/74 (10/09/16 1128)  Resp: 18 (10/09/16 1128)  SpO2: 99 % (10/09/16 1128)  Weight: 62.7 kg (138 lb 3.7 oz) (10/09/16 1128)    Physical Exam   Constitutional: She is oriented to person, place, and time. She appears well-developed and well-nourished.   HENT:   Head: Normocephalic.   Oral thrush   Eyes: Pupils are equal, round, and reactive to light.   Neck: Normal range of motion.   Cardiovascular: Normal rate and regular rhythm.    Pulmonary/Chest: Effort normal and breath sounds normal.   Abdominal: Soft. Bowel sounds are normal.   Musculoskeletal: Normal range of motion.   Neurological: She is alert and oriented to person, place, and time.   Skin: Skin is warm and dry.   Psychiatric: She has a normal mood and affect. Her behavior is normal.   Nursing note and vitals reviewed.      INITIAL ASSESSMENT & PLAN, MEDICAL DECISION MAKING, ED COURSE  Debbie Freeman is a [age]yr female who presents with a chief complaint of oral thrush and in need of a pregnancy test.     Differential includes, but is not limited to: Oral thrush    The results of the ED evaluation were notable for the following:    Pertinent lab results: Beta hCG hCG is around 3000      Chart Review: I reviewed the patient's prior medical records. Pertinent information that is relevant to this encounter.    PATIENT SUMMARY  [age] year old female who is approximately 3167 weeks pregnant.  Had an inconclusive ultrasound 1 week ago with a negative UPT and a positive beta-hCG.  Today her beta-hCG is almost 3000 and her UPT is positive.  Denies any other vaginal complaints.  She also complains of oral thrush and is presently taking nystatin swish and swallow.  Due to her pregnancy were unable to  offer any other antifungal medications at this time.  She will continue with the oral nystatin.  Also recommend that she establish with [facility] and obtain a primary care doctor.  She is also taking prenatal vitamins.  At the time of discharge, patient was able to walk without difficulty.      LAST VITAL SIGNS:  Temp: 37.4 C (99.3 F) (10/09/16 1128)  Temp src: (not recorded)  Pulse: 99 (10/09/16 1128)  BP: 133/74 (10/09/16 1128)  Resp: 18 (10/09/16 1128)  SpO2: 99 % (10/09/16 1128)  Weight: 62.7 kg (138 lb 3.7 oz) (10/09/16 1128)      Clinical Impression: First trimester pregnancy                                     Oral thrush         Disposition: Discharge. Follow up with Primary care doctor in 1 week. ED discharge instructions were reviewed and provided.      PATIENT'S GENERAL CONDITION:  Good: Vital signs are stable and within normal limits. Patient is conscious and comfortable. Indicators are excellent.           Electronically signed by: Carolin CoyKerry F Mc Mahon, MD, Attending Physician

## 2016-10-09 NOTE — ED Nursing Note (Signed)
Pt discharged by MD. No further nursing care was provided.

## 2016-10-09 NOTE — Discharge Instructions (Signed)
Establish with a OB doctor.     Use Nystatin swish and swallow/spit.

## 2016-10-09 NOTE — ED Triage Note (Signed)
Requesting that she "get pregnancy hormone checked". Says she is 4-[redacted] weeks pregnant and had ultrasound last Thursday that didn't see anything. Denies bleeding. Has oral thrush, "but that's not what I'm here for".

## 2017-07-06 LAB — HM PAP SMEAR: HM PAP: NEGATIVE

## 2017-08-01 ENCOUNTER — Encounter: Payer: Self-pay | Admitting: Emergency Medicine

## 2017-08-01 ENCOUNTER — Emergency Department
Admission: EM | Admit: 2017-08-01 | Discharge: 2017-08-01 | Disposition: A | Discharge status: Home / Self Care | Payer: BLUE CROSS/BLUE SHIELD | Attending: Student in an Organized Health Care Education/Training Program | Admitting: Student in an Organized Health Care Education/Training Program

## 2017-08-01 ENCOUNTER — Other Ambulatory Visit: Payer: Self-pay

## 2017-08-01 DIAGNOSIS — R21 Rash and other nonspecific skin eruption: Secondary | ICD-10-CM | POA: Diagnosis not present

## 2017-08-01 MED ORDER — DEXAMETHASONE SODIUM PHOSPHATE 10 MG/ML IJ SOLN
10.0000 mg | Freq: Once | INTRAMUSCULAR | Status: AC
  Administered 2017-08-01: 10 mg via INTRAMUSCULAR
  Filled 2017-08-01: qty 1

## 2017-08-01 MED ORDER — CEPHALEXIN 500 MG PO CAPS: 500.0000 mg | ORAL_CAPSULE | Freq: Three times a day (TID) | ORAL | 0 refills | Status: DC

## 2017-08-01 MED ORDER — PREDNISONE 10 MG PO TABS: ORAL_TABLET | ORAL | 0 refills | Status: DC

## 2017-08-01 NOTE — Discharge Instructions (Signed)
Begin taking prednisone tomorrow once a day for the next 3 days.  Begin Keflex today 3 times daily for the next 7 days.  Do not apply any lotions or creams to your face.  You may take Benadryl as needed for itching. Do not take these medications while breast-feeding.  Also follow-up with 1 of the dermatology offices listed on your discharge papers if any continued problems.  The 2 offices that are listed on your discharge papers in [place].

## 2017-08-01 NOTE — ED Notes (Signed)
Pt c/o rash/itching on region around the eyebrows, jaw, and near the nose. Pt states that on wed she had makeup on and was in the sun and her face began to start itching when the makeup was coming off from sweat. On wed night, pt states she used wipes to clean face when she noticed the rash starting to form. As the days progressed, she noticed the rash starting to get worse and went to urgent care yesterday. The prescribed mupirocin for impetigo and the rash has started to harden and crust.

## 2017-08-01 NOTE — ED Provider Notes (Signed)
[facility] Emergency Department Provider Note  ____________________________________________   First MD Initiated Contact with Patient 08/01/17 1118     (approximate)  I have reviewed the triage vital signs and the nursing notes.   HISTORY  Chief Complaint Rash   HPI Wanda Morales is a 27 y.o. female is here with her mother with complaint of rash to her face.  Patient states that rash began approximately 3 days ago.  She states that area has been itching and she has been using over-the-counter cortisone cream to get with minimal relief.  Patient states that she has had some pustules and mother is adamant that that we culture the area.  Denies any pain or fever at this time.  History reviewed. No pertinent past medical history.  Patient Active Problem List   Diagnosis Date Noted  . Pyelonephritis, acute 06/13/2012    Past Surgical History:  Procedure Laterality Date  . INDUCED ABORTION      Prior to Admission medications   Medication Sig Start Date End Date Taking? Authorizing Provider  BIOTIN PO Take 1 capsule by mouth daily.    [provider]  cephALEXin (KEFLEX) 500 MG capsule Take 1 capsule (500 mg total) by mouth 3 (three) times daily. 08/01/17   Tommi Rumps, PA-C  ibuprofen (ADVIL,MOTRIN) 200 MG tablet Take 800 mg by mouth daily as needed for pain. For pain    [provider]  ondansetron (ZOFRAN-ODT) 8 MG disintegrating tablet Take 8 mg by mouth every 4 (four) hours as needed.  ODT q4 hours prn nausea 06/13/12   Ivonne Andrew, PA-C  predniSONE (DELTASONE) 10 MG tablet Take 3 tablets once a day for 3 days 08/01/17   Tommi Rumps, PA-C    Allergies Patient has no known allergies.  No family history on file.  Social History Social History   Tobacco Use  . Smoking status: Never Smoker  Substance Use Topics  . Alcohol use: Yes  . Drug use: No    Review of Systems Constitutional: No fever/chills Eyes: No  visual changes. ENT: No sore throat. Cardiovascular: Denies chest pain. Respiratory: Denies shortness of breath. Musculoskeletal: Negative for back pain. Skin: Positive for facial rash. ____________________________________________   PHYSICAL EXAM:  VITAL SIGNS: ED Triage Vitals  Enc Vitals Group     BP 08/01/17 1040 122/88     Pulse Rate 08/01/17 1040 92     Resp 08/01/17 1040 16     Temp 08/01/17 1040 98.4 F (36.9 C)     Temp Source 08/01/17 1040 Oral     SpO2 08/01/17 1040 98 %     Weight 08/01/17 1043 165 lb (74.8 kg)     Height --      Head Circumference --      Peak Flow --      Pain Score 08/01/17 1042 0     Pain Loc --      Pain Edu? --      Excl. in GC? --    Constitutional: Alert and oriented. Well appearing and in no acute distress. Eyes: Conjunctivae are normal.  Head: Atraumatic. Nose: No congestion/rhinnorhea. Neck: No stridor.   Cardiovascular: Normal rate, regular rhythm. Grossly normal heart sounds.  Good peripheral circulation. Respiratory: Normal respiratory effort.  No retractions. Lungs CTAB. Musculoskeletal: Moves upper and lower extremities without any difficulty and normal gait was noted. Neurologic:  Normal speech and language. No gross focal neurologic deficits are appreciated.  Skin:  Skin is warm,  dry. Rash is localized to her eyebrows and area between her eyebrows.  She also has some areas to her upper lip.  There is small vesicles but no drainage is noted.  Psychiatric: Mood and affect are normal. Speech and behavior are normal.  ____________________________________________   LABS (all labs ordered are listed, but only abnormal results are displayed)  Labs Reviewed  AEROBIC CULTURE (SUPERFICIAL SPECIMEN)     PROCEDURES  Procedure(s) performed: None  Procedures  Critical Care performed: No  ____________________________________________   INITIAL IMPRESSION / ASSESSMENT AND PLAN / ED COURSE  Patient is here with a rash to  her face and has been taking Benadryl without any relief.  Patient currently has an infant at home and has been breast-feeding but states that she has enough milk saved up that she will not need to breast-feed for some time and agrees to pump and throw this away.  Patient was given Decadron 10 mg IM while in the department.  Patient was discharged with Keflex 500 mg 3 times daily for 7 days and prednisone 30 mg daily for the next 3 days.  She is to follow-up with 1 of the dermatologist listed on her discharge papers.  Culture of the area was done per mother's request.  She is aware that this test takes 72 hours.  ____________________________________________   FINAL CLINICAL IMPRESSION(S) / ED DIAGNOSES  Final diagnoses:  Rash and nonspecific skin eruption     ED Discharge Orders        Ordered    cephALEXin (KEFLEX) 500 MG capsule  3 times daily     08/01/17 1150    predniSONE (DELTASONE) 10 MG tablet     08/01/17 1150       Note:  This document was prepared using Dragon voice recognition software and may include unintentional dictation errors.    Tommi Rumps, PA-C 08/01/17 1250    Willy Eddy, MD 08/01/17 [phone]

## 2017-08-01 NOTE — ED Triage Notes (Signed)
Pt to ED via POV c/o rash on face x 3 days. Pt in NAD at this time.

## 2017-08-03 LAB — AEROBIC CULTURE  (SUPERFICIAL SPECIMEN)
CULTURE: NO GROWTH
GRAM STAIN: NONE SEEN
SPECIAL REQUESTS: NORMAL

## 2018-01-06 ENCOUNTER — Ambulatory Visit: Payer: BLUE CROSS/BLUE SHIELD | Admitting: Family Medicine

## 2018-01-08 ENCOUNTER — Encounter: Payer: Self-pay | Admitting: Family Medicine

## 2018-01-11 ENCOUNTER — Ambulatory Visit: Payer: BLUE CROSS/BLUE SHIELD | Admitting: Family Medicine

## 2018-01-12 NOTE — Progress Notes (Signed)
Pt encounter opened erroneously. Pt never showed for office visit

## 2021-05-23 ENCOUNTER — Ambulatory Visit
Admission: EM | Admit: 2021-05-23 | Discharge: 2021-05-23 | Disposition: A | Discharge status: Home / Self Care | Payer: Commercial Managed Care - PPO | Attending: Emergency Medicine | Admitting: Emergency Medicine

## 2021-05-23 ENCOUNTER — Other Ambulatory Visit: Payer: Self-pay

## 2021-05-23 DIAGNOSIS — R21 Rash and other nonspecific skin eruption: Secondary | ICD-10-CM

## 2021-05-23 MED ORDER — TRIAMCINOLONE ACETONIDE 0.5 % EX OINT: 1.0000 "application " | TOPICAL_OINTMENT | Freq: Three times a day (TID) | CUTANEOUS | 0 refills | Status: AC

## 2021-05-23 NOTE — Discharge Instructions (Addendum)
Rest, push fluids, apply ointment as directed.  Follow-up with your PCP avoid heat hot water as it makes rashes worse.

## 2021-05-23 NOTE — ED Provider Notes (Signed)
[facility]    CSN: [id number] Arrival date & time: 05/23/21  1734      History   Chief Complaint Chief Complaint  Patient presents with   Rash    HPI Wanda Morales is a 31 y.o. female.   31 year old female pt, Wanda Morales, presents to urgent care with chief complaint of rash to back of neck x5 days "where collar from coat rubbed "on her skin causing irritation patient has been scratching there since.  No treatment tried.  Patient has a history of eczema.  The history is provided by the patient. No language interpreter was used.   Past Medical History:  Diagnosis Date   Eczema    GAD (generalized anxiety disorder)    HSV-2 infection     Patient Active Problem List   Diagnosis Date Noted   Rash and nonspecific skin eruption 05/23/2021    Past Surgical History:  Procedure Laterality Date   INDUCED ABORTION      OB History   No obstetric history on file.      Home Medications    Prior to Admission medications   Medication Sig Start Date End Date Taking? Authorizing Provider  triamcinolone ointment (KENALOG) 0.5 % Apply 1 application topically 3 (three) times daily for 10 days. 05/23/21 06/02/21 Yes Damyan Corne, Para March, NP  norethindrone (MICRONOR,CAMILA,ERRIN) 0.35 MG tablet Take 1 tablet by mouth daily. 07/06/17   [provider]  valACYclovir (VALTREX) 500 MG tablet Take 1 tablet by mouth 2 (two) times daily. 07/06/17   [provider]    Family History Family History  Problem Relation Age of Onset   Hypertension Father    Breast cancer Maternal Grandmother    Stroke Paternal Grandmother    Diabetes Paternal Grandfather    Hypertension Paternal Grandfather    Liver cancer Paternal Grandfather    Stroke Paternal Grandfather     Social History Social History   Tobacco Use   Smoking status: Never   Smokeless tobacco: Never  Substance Use Topics   Alcohol use: Yes   Drug use: No     Allergies   Patient has no known  allergies.   Review of Systems Review of Systems  Constitutional:  Negative for fever.  Skin:  Positive for rash.  All other systems reviewed and are negative.   Physical Exam Triage Vital Signs ED Triage Vitals  Enc Vitals Group     BP      Pulse      Resp      Temp      Temp src      SpO2      Weight      Height      Head Circumference      Peak Flow      Pain Score      Pain Loc      Pain Edu?      Excl. in GC?    No data found.  Updated Vital Signs BP 108/80    Pulse 82    Temp 98.8 F (37.1 C)    Resp 18    LMP 05/09/2021    SpO2 100%   Visual Acuity Right Eye Distance:   Left Eye Distance:   Bilateral Distance:    Right Eye Near:   Left Eye Near:    Bilateral Near:     Physical Exam Vitals and nursing note reviewed.  Constitutional:      General: She is not in  acute distress.    Appearance: She is well-developed.  HENT:     Head: Normocephalic.  Eyes:     General: Lids are normal.     Pupils: Pupils are equal, round, and reactive to light.  Neck:     Trachea: Trachea normal.   Cardiovascular:     Rate and Rhythm: Normal rate.  Pulmonary:     Effort: Pulmonary effort is normal.  Musculoskeletal:        General: No tenderness. Normal range of motion.     Cervical back: Normal range of motion.  Lymphadenopathy:     Cervical: No cervical adenopathy.  Skin:    General: Skin is warm and dry.     Findings: Rash present. No erythema. Rash is macular and papular. Rash is not urticarial or vesicular.  Neurological:     General: No focal deficit present.     Mental Status: She is alert and oriented to person, place, and time.     GCS: GCS eye subscore is 4. GCS verbal subscore is 5. GCS motor subscore is 6.     Cranial Nerves: No cranial nerve deficit.     Sensory: No sensory deficit.  Psychiatric:        Attention and Perception: Attention normal.        Mood and Affect: Mood normal.        Speech: Speech normal.        Behavior: Behavior  normal. Behavior is cooperative.     UC Treatments / Results  Labs (all labs ordered are listed, but only abnormal results are displayed) Labs Reviewed - No data to display  EKG   Radiology No results found.  Procedures Procedures (including critical care time)  Medications Ordered in UC Medications - No data to display  Initial Impression / Assessment and Plan / UC Course  I have reviewed the triage vital signs and the nursing notes.  Pertinent labs & imaging results that were available during my care of the patient were reviewed by me and considered in my medical decision making (see chart for details).     Ddx: Rash, dermatitis, allergic reaction Final Clinical Impressions(s) / UC Diagnoses   Final diagnoses:  Rash and nonspecific skin eruption     Discharge Instructions      Rest, push fluids, apply ointment as directed.  Follow-up with your PCP avoid heat hot water as it makes rashes worse.     ED Prescriptions     Medication Sig Dispense Auth. Provider   triamcinolone ointment (KENALOG) 0.5 % Apply 1 application topically 3 (three) times daily for 10 days. 30 g Marvelle Caudill, Para March, NP      PDMP not reviewed this encounter.   Clancy Gourd, NP 05/23/21 1849

## 2021-05-23 NOTE — ED Triage Notes (Signed)
Pt presents with rash on back of neck for past week
